# Patient Record
Sex: Male | Born: 1975
Health system: Southern US, Community
[De-identification: ages and names within clinical notes are randomized; demographics above are authoritative.]

## PROBLEM LIST (undated history)

## (undated) DIAGNOSIS — J342 Deviated nasal septum: Secondary | ICD-10-CM

## (undated) DIAGNOSIS — D563 Thalassemia minor: Secondary | ICD-10-CM

## (undated) DIAGNOSIS — J45909 Unspecified asthma, uncomplicated: Secondary | ICD-10-CM

## (undated) HISTORY — PX: TYMPANOSTOMY TUBE PLACEMENT: SHX32

## (undated) HISTORY — DX: Thalassemia minor: D56.3

## (undated) HISTORY — DX: Deviated nasal septum: J34.2

## (undated) HISTORY — DX: Unspecified asthma, uncomplicated: J45.909

---

## 2007-05-13 ENCOUNTER — Encounter: Payer: Self-pay | Admitting: Orthopedic Surgery

## 2007-05-15 ENCOUNTER — Ambulatory Visit: Payer: Self-pay | Admitting: Orthopedic Surgery

## 2007-05-15 DIAGNOSIS — S62329A Displaced fracture of shaft of unspecified metacarpal bone, initial encounter for closed fracture: Secondary | ICD-10-CM | POA: Insufficient documentation

## 2007-06-26 ENCOUNTER — Ambulatory Visit: Payer: Self-pay | Admitting: Orthopedic Surgery

## 2009-06-12 ENCOUNTER — Emergency Department (HOSPITAL_COMMUNITY): Admission: EM | Admit: 2009-06-12 | Discharge: 2009-06-12 | Payer: Self-pay | Admitting: Emergency Medicine

## 2011-05-20 IMAGING — CR DG CHEST 2V
2 series · 2 of 2 positions shown · non-contrast
Comparison: None.

CLINICAL DATA: Fever, cough, shortness of breath

CHEST - 2 VIEW

[w chest pa]
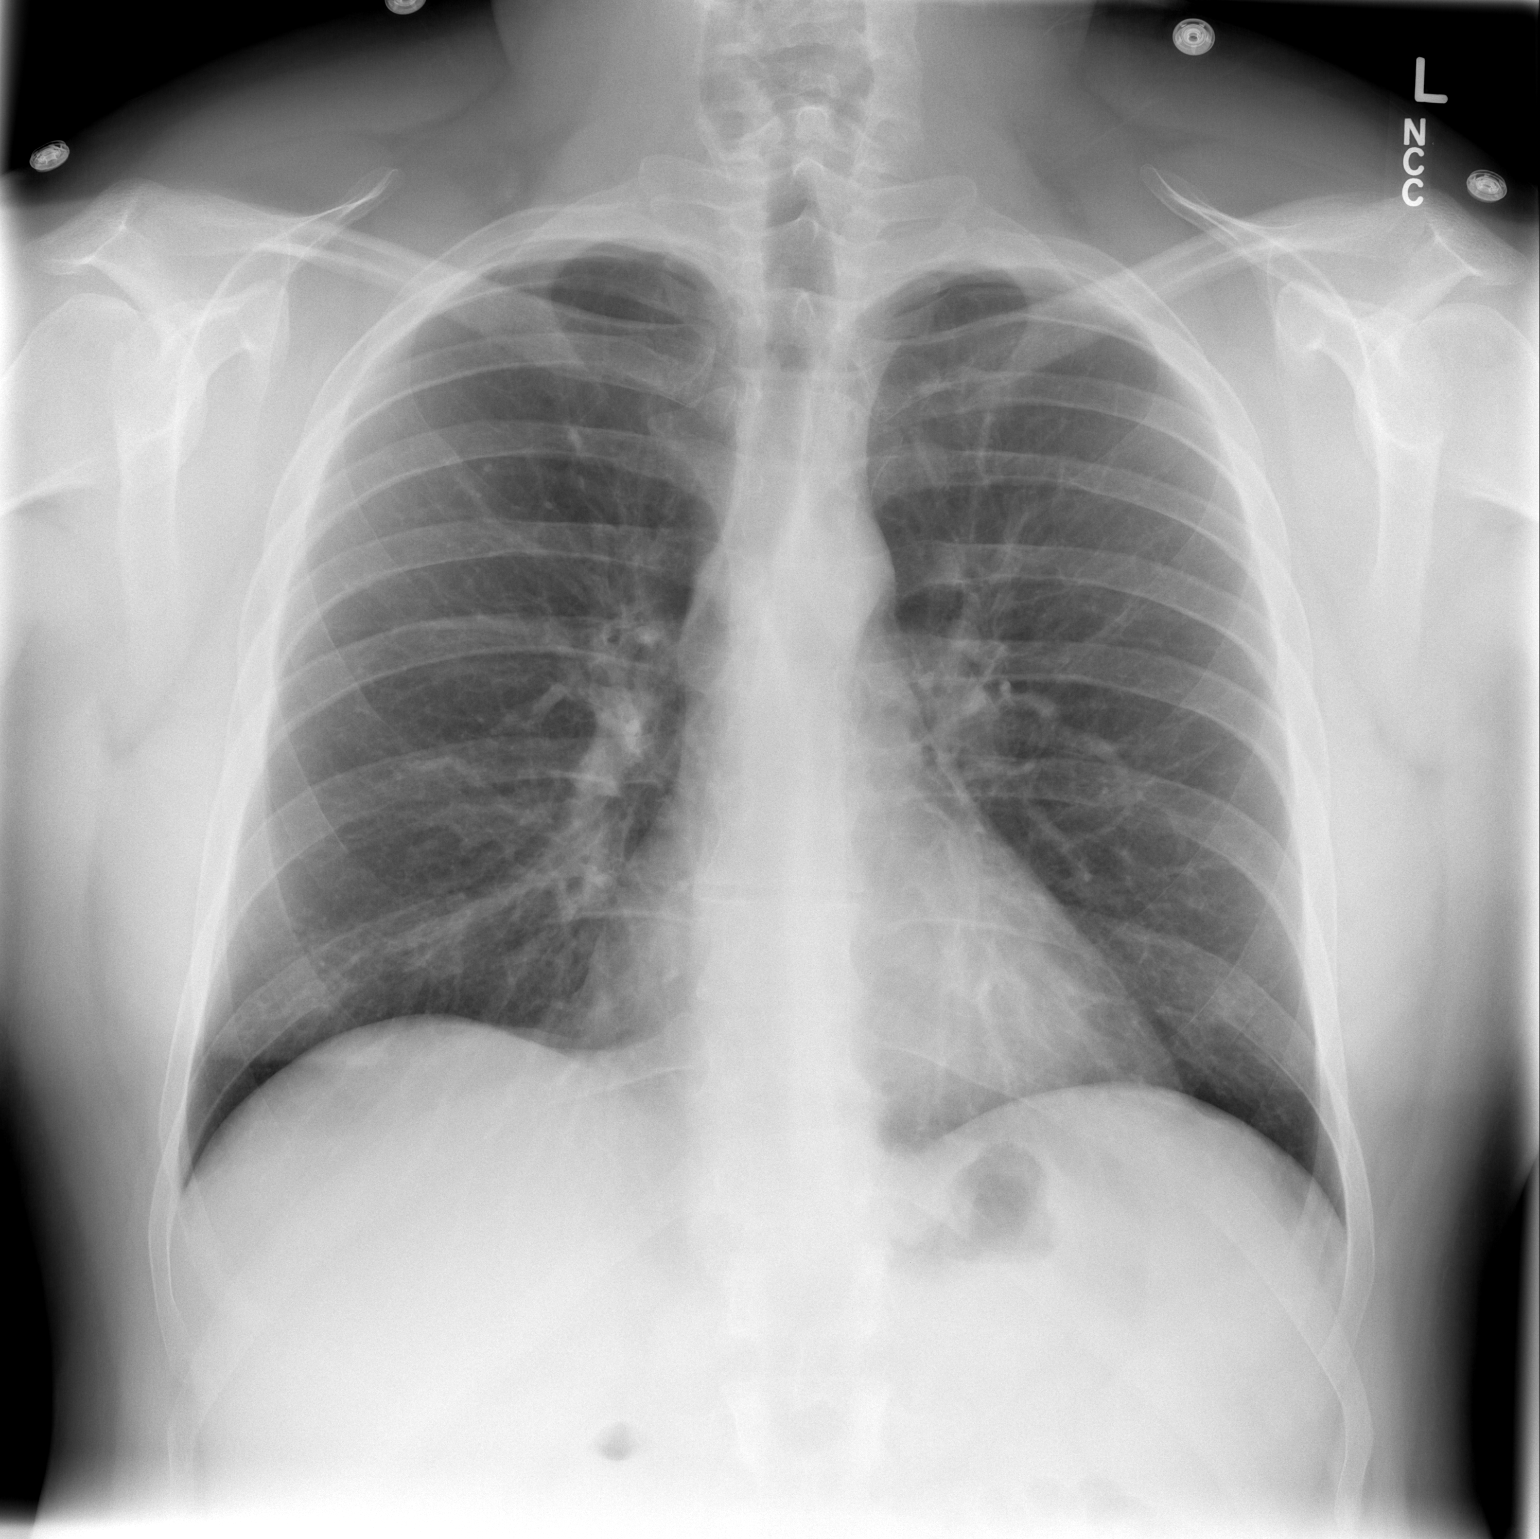

[w chest lat]
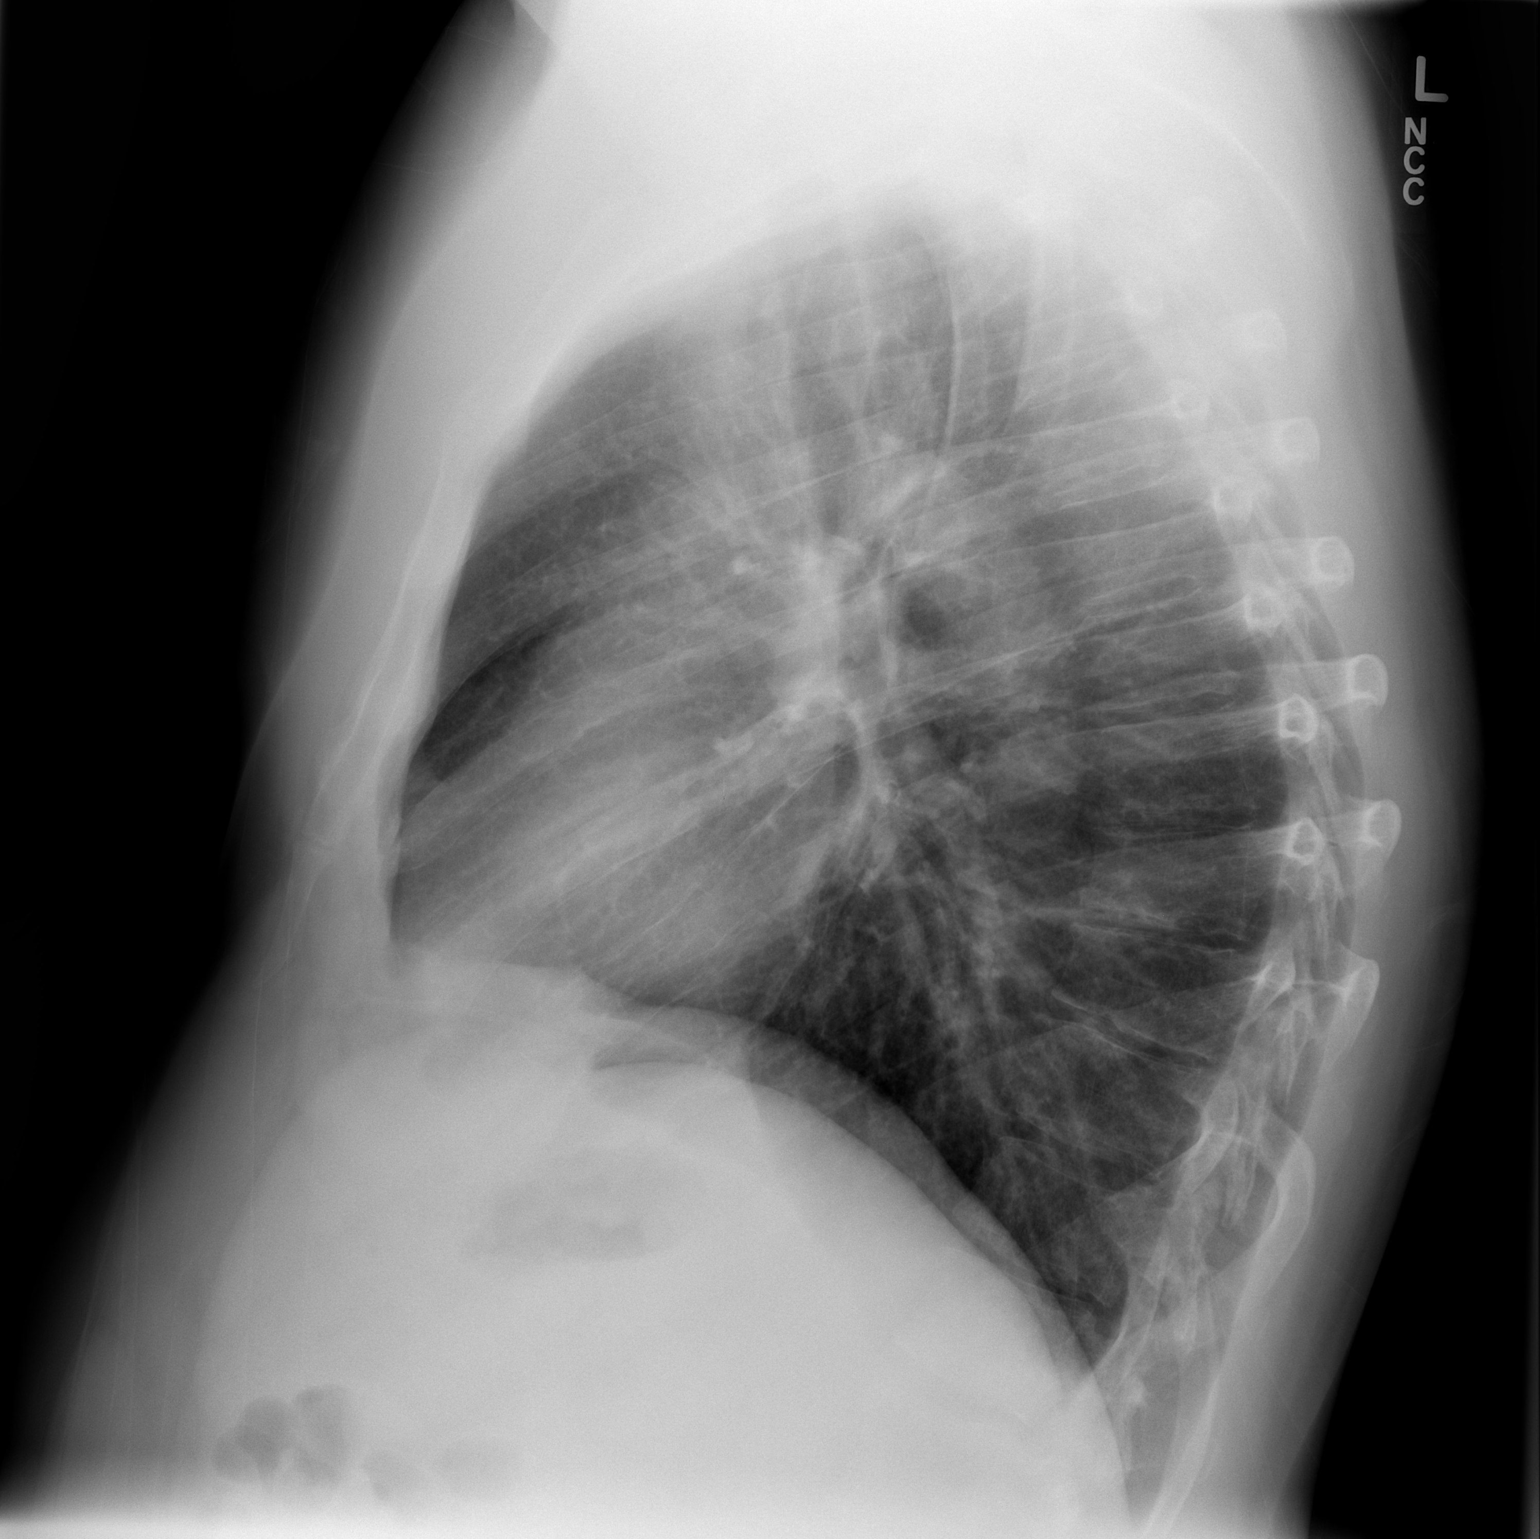

[2 of 2 positions shown; findings below may reference images not displayed]

FINDINGS: Cardiomediastinal silhouette is within normal limits. The
lungs are clear. No pleural effusion.  No pneumothorax.  No acute
osseous abnormality.
IMPRESSION: Normal exam.

## 2019-03-02 ENCOUNTER — Ambulatory Visit (INDEPENDENT_AMBULATORY_CARE_PROVIDER_SITE_OTHER): Payer: Self-pay | Admitting: Internal Medicine

## 2019-03-09 ENCOUNTER — Ambulatory Visit (INDEPENDENT_AMBULATORY_CARE_PROVIDER_SITE_OTHER): Payer: Self-pay | Admitting: Nurse Practitioner

## 2019-03-10 ENCOUNTER — Other Ambulatory Visit: Payer: Self-pay

## 2019-03-10 ENCOUNTER — Other Ambulatory Visit: Payer: Self-pay | Admitting: *Deleted

## 2019-03-10 DIAGNOSIS — Z20822 Contact with and (suspected) exposure to covid-19: Secondary | ICD-10-CM

## 2019-03-13 LAB — NOVEL CORONAVIRUS, NAA: SARS-CoV-2, NAA: DETECTED — AB

## 2019-03-24 ENCOUNTER — Other Ambulatory Visit: Payer: Self-pay

## 2019-03-24 ENCOUNTER — Ambulatory Visit: Payer: 59 | Attending: Internal Medicine

## 2019-03-24 DIAGNOSIS — Z20822 Contact with and (suspected) exposure to covid-19: Secondary | ICD-10-CM

## 2019-03-25 LAB — NOVEL CORONAVIRUS, NAA: SARS-CoV-2, NAA: NOT DETECTED

## 2019-03-30 ENCOUNTER — Ambulatory Visit (INDEPENDENT_AMBULATORY_CARE_PROVIDER_SITE_OTHER): Payer: 59 | Admitting: Nurse Practitioner

## 2019-03-30 ENCOUNTER — Encounter (INDEPENDENT_AMBULATORY_CARE_PROVIDER_SITE_OTHER): Payer: Self-pay | Admitting: Nurse Practitioner

## 2019-03-30 ENCOUNTER — Other Ambulatory Visit: Payer: Self-pay

## 2019-03-30 VITALS — BP 128/76 | HR 89 | Temp 98.6°F | Resp 18 | Ht 70.0 in | Wt 216.0 lb

## 2019-03-30 DIAGNOSIS — Z0001 Encounter for general adult medical examination with abnormal findings: Secondary | ICD-10-CM | POA: Diagnosis not present

## 2019-03-30 DIAGNOSIS — K5900 Constipation, unspecified: Secondary | ICD-10-CM | POA: Diagnosis not present

## 2019-03-30 DIAGNOSIS — R5383 Other fatigue: Secondary | ICD-10-CM | POA: Insufficient documentation

## 2019-03-30 DIAGNOSIS — Z1329 Encounter for screening for other suspected endocrine disorder: Secondary | ICD-10-CM

## 2019-03-30 DIAGNOSIS — J310 Chronic rhinitis: Secondary | ICD-10-CM

## 2019-03-30 DIAGNOSIS — Z131 Encounter for screening for diabetes mellitus: Secondary | ICD-10-CM

## 2019-03-30 DIAGNOSIS — R234 Changes in skin texture: Secondary | ICD-10-CM | POA: Insufficient documentation

## 2019-03-30 DIAGNOSIS — Z139 Encounter for screening, unspecified: Secondary | ICD-10-CM

## 2019-03-30 DIAGNOSIS — R0683 Snoring: Secondary | ICD-10-CM | POA: Insufficient documentation

## 2019-03-30 DIAGNOSIS — Z13220 Encounter for screening for lipoid disorders: Secondary | ICD-10-CM

## 2019-03-30 DIAGNOSIS — Z113 Encounter for screening for infections with a predominantly sexual mode of transmission: Secondary | ICD-10-CM

## 2019-03-30 MED ORDER — FLUTICASONE PROPIONATE 50 MCG/ACT NA SUSP: 2.0000 | Freq: Every day | NASAL | 6 refills | Status: DC

## 2019-03-30 NOTE — Assessment & Plan Note (Signed)
Physical exam was generally benign.  We will collect blood work in 2 days when he will be fasting.  Further recommendations may be made based upon those results.

## 2019-03-30 NOTE — Assessment & Plan Note (Signed)
Unsure of current etiology.  It is probably caused by multiple etiologies.  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  I will collect blood work for further evaluation today.

## 2019-03-30 NOTE — Assessment & Plan Note (Signed)
We discussed the importance of maintaining proper hydration as well as eating enough fiber in his diet.  He was wondering whether or not he would be at [facility] that he could undergo colon cancer screening.  I told him that generally insurance companies will cover screening at the age of 74, and sometimes earlier if he has risk factors.  He denies any family history of colon cancer.  He also denies any current symptoms that are commonly associated with colon cancer.  I encouraged him to notify me if he starts to experience any bloody stools, black tarry stools, unexplained weight loss, worsening fatigue, worsening abdominal pain, worsening constipation.  He tells me he understands.  I recommended currently for his constipation that he focus on drinking at least 100 ounces of water a day as well as trying to include his daily fiber intake.  I recommended that he try over-the-counter docusate sodium as needed for constipation.  If this worsens or continues we will consider referral to gastroenterology.

## 2019-03-30 NOTE — Progress Notes (Signed)
Subjective:  Patient ID: Ethan Weber, male    DOB: 03-05-1976  Age: 43 y.o. MRN: [id number]  CC:  Chief Complaint  Patient presents with  . New Patient (Initial Visit)      HPI  This patient presents the office today to establish care.  He heard about [place] via his mom and stepfather who are patient's here as well.  He generally feels well, but does express some concerns regarding abdominal bloating, snoring, nasal congestion, and a scab to his right ear.  He tells me he does have intermittent abdominal bloating.  He tells me that he does feel he is mildly constipated at times.  He will sometimes have a bowel movement daily other times he can go 2 to 3 days without a bowel movement.  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  He tells me he drinks about 5-6 water bottles a day when he is working, and then may be 2-3 water bottles when he is not working.  He denies any melena, blood in his stool, abnormal unintentional weight loss, severe fatigue.  He does report that he does get mildly fatigued at times.    He also reports that he snores, and uses a CPAP.  He tells me he did attempt to undergo sleep study in the past, but was not able to fall asleep during the study.  He is not sure if he really needs a CPAP or not.  He would like to be evaluated for sleep apnea again.  He also mentions that he has chronic nasal congestion.  He also experiences a feeling of fullness in his ears and a desire to pop his ears.  He is concerned he has a lot of earwax buildup and does use drops for this.  He tells me he has used nasal spray in the past and this has helped, but the congestion always seems to come back.  He also mentions that he has a scab to the right earlobe.  He would like this to be evaluated today as well.  Past Medical History:  Diagnosis Date  . Asthma    Childhood   . Deviated septum       Family History  Problem Relation Age of Onset  . ALS Father   . Heart disease Maternal Grandmother     Social History   Social History Narrative  . Not on file   Social History   Tobacco Use  . Smoking status: Former Smoker    Packs/day: 0.50    Years: 2.00    Pack years: 1.00    Types: Cigarettes    Quit date: 04/09/1998    Years since quitting: 20.9  . Smokeless tobacco: Former Engineer, water Use Topics  . Alcohol use: Yes    Comment: Once a week     No outpatient medications have been marked as taking for the 03/30/19 encounter (Office Visit) with Elenore Paddy, NP.    ROS:  Review of Systems  Constitutional: Positive for malaise/fatigue. Negative for fever.  HENT: Positive for congestion. Negative for ear discharge and ear pain.        Snoring  Eyes: Negative for blurred vision.  Respiratory: Negative for cough, shortness of breath and wheezing.   Cardiovascular: Negative for chest pain and palpitations.  Gastrointestinal: Positive for constipation (varies between daily to  every 2-3 days) and heartburn. Negative for abdominal pain, blood in stool, melena, nausea and vomiting.  Genitourinary: Negative for dysuria and hematuria.  Neurological: Negative for dizziness, sensory change, weakness and headaches.  Psychiatric/Behavioral: Negative for suicidal ideas.     Objective:   Today's Vitals: BP 128/76 (BP Location: Right Arm, Patient Position: Sitting, Cuff Size: Normal)   Pulse 89   Temp 98.6 F (37 C) (Temporal)   Resp 18   Ht 5\' 10"  (1.778 m)   Wt 216 lb (98 kg)   SpO2 96% Comment: wearing mask.  BMI 30.99 kg/m  Vitals with BMI 03/30/2019 05/15/2007  Height 5\' 10"  -  Weight 216 lbs 180 lbs  BMI 30.99 -  Systolic 128 -  Diastolic 76 -  Pulse 89 76     Physical Exam Vitals reviewed.  Constitutional:      General: He is not in acute distress.    Appearance: Normal appearance. He is obese. He is not ill-appearing.    HENT:     Head: Normocephalic and atraumatic.      Right Ear: Tympanic membrane, ear canal and external ear normal.     Left Ear: Tympanic membrane, ear canal and external ear normal.  Eyes:     General: No scleral icterus.    Extraocular Movements: Extraocular movements intact.     Conjunctiva/sclera: Conjunctivae normal.     Pupils: Pupils are equal, round, and reactive to light.  Neck:     Vascular: No carotid bruit.  Cardiovascular:     Rate and Rhythm: Normal rate and regular rhythm.     Pulses: Normal pulses.     Heart sounds: Normal heart sounds.  Pulmonary:     Effort: Pulmonary effort is normal.     Breath sounds: Normal breath sounds.  Abdominal:     General: Bowel sounds are normal. There is no distension.     Palpations: There is no mass.     Tenderness: There is no abdominal tenderness.     Hernia: No hernia is present.  Musculoskeletal:        General: No swelling or tenderness.     Cervical back: Normal range of motion and neck supple. No rigidity.  Lymphadenopathy:     Cervical: No cervical adenopathy.  Skin:    General: Skin is warm and dry.  Neurological:     General: No focal deficit present.     Mental Status: He is alert and oriented to person, place, and time.     Cranial Nerves: No cranial nerve deficit.     Sensory: No sensory deficit.     Motor: No weakness.     Gait: Gait normal.  Psychiatric:        Mood and Affect: Mood normal.        Behavior: Behavior normal.        Judgment: Judgment normal.      PHQ 2: Negative for depression    Assessment   1. Rhinitis, unspecified type       Tests ordered No orders of the defined types were placed in this encounter.    Plan: Please see assessment and plan per problem list below.   Meds ordered this encounter  Medications  . fluticasone (FLONASE) 50 MCG/ACT nasal spray    Sig: Place 2 sprays into both nostrils daily.    Dispense:  16 g    Refill:  6    Order Specific Question:    Supervising Provider  AnswerWilson Singer [[id number]]    Patient to follow-up in 6 weeks.  In addition to performing an annual physical exam today I also performed an office visit to address his concerns as stated above.  Elenore Paddy, NP

## 2019-03-30 NOTE — Patient Instructions (Signed)
Thank you for choosing [facility] as your medical provider! If you have any questions or concerns regarding your health care, please do not hesitate to call our office.  Constipation: Try to make sure you are getting enough fiber from your diet, as well as drinking enough water.  You can get fiber from fruits, vegetables, beans, lentils.  You should have a goal of drinking around 100 ounces of water a day.  If you still have mild constipation intermittently you can try docusate sodium (colace).  This is a medication that she can find over-the-counter.  Snoring/nasal congestion: Use the Flonase nasal spray as prescribed.  I will also refer you to [facility] for further evaluation for sleep apnea.  Ear fullness/feeling like you need to pop your ears: Using Flonase should also help with this symptom.  Try to avoid using the earwax drops more than 4 days in a row.  This is to protect the skin integrity of your healthy skin of your ear.  If the scab to your right ear starts to hurt, you can use over-the-counter Neosporin.  If this does not relieve the pain or if the area starts to get red, swollen, hot, and moderately to severely painful please notify me because it may be that you are getting an infection.  Blood work: We will collect blood work in approximately 2 days.  Please remember to come fasting to that appointment.  I will discuss these results with you either via MyChart or by phone.  Please follow-up as scheduled in 6 weeks. We look forward to seeing you again soon! Have a great Christmas!!  At [facility] we value your feedback. You may receive a survey about your visit today. Please share your experience as we strive to create trusting relationships with our patients to provide genuine, compassionate, quality care.  We appreciate your understanding and patience as we review any laboratory studies, imaging, and other diagnostic tests that are ordered as  we care for you. We do our best to address any and all results in a timely manner. If you do not hear about test results within 1 week, please do not hesitate to contact us. If we referred you to a specialist during your visit or ordered imaging testing, contact the office if you have not been contacted to be scheduled within 1 weeks.  We also encourage the use of MyChart, which contains your medical information for your review as well. If you are not enrolled in this feature, an access code is on this after visit summary for your convenience. Thank you for allowing [place] to be involved in your care.

## 2019-03-30 NOTE — Assessment & Plan Note (Signed)
I will refer him to [facility] for further evaluation of sleep apnea.

## 2019-03-30 NOTE — Assessment & Plan Note (Signed)
I prescribed Flonase nasal spray that he can use 1 to 2 sprays daily as needed for his congestion.  I encouraged him to let me know if this does not improve his congestion.  We will discuss this again at his next follow-up.

## 2019-03-30 NOTE — Assessment & Plan Note (Signed)
Lesion appears to not be infected at this time.  I encouraged him to not pick at the lesion.  I encouraged him to use over-the-counter Neosporin as needed, and to notify me if the area starts to become painful, swell, becomes red, starts to feel warm to touch.  He tells me he understands.

## 2019-04-01 ENCOUNTER — Other Ambulatory Visit (INDEPENDENT_AMBULATORY_CARE_PROVIDER_SITE_OTHER): Payer: Self-pay | Admitting: Nurse Practitioner

## 2019-04-01 ENCOUNTER — Other Ambulatory Visit (INDEPENDENT_AMBULATORY_CARE_PROVIDER_SITE_OTHER): Payer: 59

## 2019-04-01 ENCOUNTER — Other Ambulatory Visit: Payer: Self-pay

## 2019-04-01 DIAGNOSIS — Z1329 Encounter for screening for other suspected endocrine disorder: Secondary | ICD-10-CM

## 2019-04-01 DIAGNOSIS — Z139 Encounter for screening, unspecified: Secondary | ICD-10-CM

## 2019-04-01 DIAGNOSIS — Z1159 Encounter for screening for other viral diseases: Secondary | ICD-10-CM

## 2019-04-01 DIAGNOSIS — Z131 Encounter for screening for diabetes mellitus: Secondary | ICD-10-CM

## 2019-04-01 DIAGNOSIS — Z0001 Encounter for general adult medical examination with abnormal findings: Secondary | ICD-10-CM

## 2019-04-01 DIAGNOSIS — Z113 Encounter for screening for infections with a predominantly sexual mode of transmission: Secondary | ICD-10-CM

## 2019-04-01 DIAGNOSIS — Z13220 Encounter for screening for lipoid disorders: Secondary | ICD-10-CM

## 2019-04-01 DIAGNOSIS — R5383 Other fatigue: Secondary | ICD-10-CM

## 2019-04-02 LAB — COMPLETE METABOLIC PANEL WITH GFR
AG Ratio: 2 (calc) (ref 1.0–2.5)
ALT: 36 U/L (ref 9–46)
AST: 30 U/L (ref 10–40)
Albumin: 4.7 g/dL (ref 3.6–5.1)
Alkaline phosphatase (APISO): 51 U/L (ref 36–130)
BUN: 14 mg/dL (ref 7–25)
CO2: 26 mmol/L (ref 20–32)
Calcium: 9.1 mg/dL (ref 8.6–10.3)
Chloride: 103 mmol/L (ref 98–110)
Creat: 1.18 mg/dL (ref 0.60–1.35)
GFR, Est African American: 88 mL/min/{1.73_m2} (ref >=60)
GFR, Est Non African American: 76 mL/min/{1.73_m2} (ref >=60)
Globulin: 2.3 g/dL (calc) (ref 1.9–3.7)
Glucose, Bld: 93 mg/dL (ref 65–99)
Potassium: 4.3 mmol/L (ref 3.5–5.3)
Sodium: 140 mmol/L (ref 135–146)
Total Bilirubin: 1 mg/dL (ref 0.2–1.2)
Total Protein: 7 g/dL (ref 6.1–8.1)

## 2019-04-02 LAB — LIPID PANEL
Cholesterol: 278 mg/dL — ABNORMAL HIGH (ref <200)
HDL: 53 mg/dL (ref >=40)
LDL Cholesterol (Calc): 191 mg/dL (calc) — ABNORMAL HIGH
Non-HDL Cholesterol (Calc): 225 mg/dL (calc) — ABNORMAL HIGH (ref <130)
Total CHOL/HDL Ratio: 5.2 (calc) — ABNORMAL HIGH (ref <5.0)
Triglycerides: 170 mg/dL — ABNORMAL HIGH (ref <150)

## 2019-04-02 LAB — HEMOGLOBIN A1C
Hgb A1c MFr Bld: 5.2 % of total Hgb (ref <5.7)
Mean Plasma Glucose: 103 (calc)
eAG (mmol/L): 5.7 (calc)

## 2019-04-02 LAB — CBC
HCT: 37.6 % — ABNORMAL LOW (ref 38.5–50.0)
Hemoglobin: 11.6 g/dL — ABNORMAL LOW (ref 13.2–17.1)
MCH: 18.8 pg — ABNORMAL LOW (ref 27.0–33.0)
MCHC: 30.9 g/dL — ABNORMAL LOW (ref 32.0–36.0)
MCV: 60.8 fL — ABNORMAL LOW (ref 80.0–100.0)
Platelets: 193 10*3/uL (ref 140–400)
RBC: 6.18 10*6/uL — ABNORMAL HIGH (ref 4.20–5.80)
RDW: 17.4 % — ABNORMAL HIGH (ref 11.0–15.0)
WBC: 5.3 10*3/uL (ref 3.8–10.8)

## 2019-04-02 LAB — HIV ANTIBODY (ROUTINE TESTING W REFLEX): HIV 1&2 Ab, 4th Generation: NONREACTIVE

## 2019-04-02 LAB — RPR: RPR Ser Ql: NONREACTIVE

## 2019-04-02 LAB — TSH: TSH: 2.37 mIU/L (ref 0.40–4.50)

## 2019-04-02 LAB — T4, FREE: Free T4: 1.1 ng/dL (ref 0.8–1.8)

## 2019-04-02 LAB — T3, FREE: T3, Free: 3.6 pg/mL (ref 2.3–4.2)

## 2019-04-02 LAB — HEPATITIS C ANTIBODY
Hepatitis C Ab: NONREACTIVE
SIGNAL TO CUT-OFF: 0.01 (ref <1.00)

## 2019-04-16 ENCOUNTER — Ambulatory Visit (INDEPENDENT_AMBULATORY_CARE_PROVIDER_SITE_OTHER): Payer: 59 | Admitting: Neurology

## 2019-04-16 ENCOUNTER — Other Ambulatory Visit: Payer: Self-pay

## 2019-04-16 ENCOUNTER — Encounter: Payer: Self-pay | Admitting: Neurology

## 2019-04-16 VITALS — BP 98/78 | HR 63 | Temp 97.9°F | Ht 70.0 in | Wt 216.0 lb

## 2019-04-16 DIAGNOSIS — R0683 Snoring: Secondary | ICD-10-CM | POA: Diagnosis not present

## 2019-04-16 DIAGNOSIS — J300 Vasomotor rhinitis: Secondary | ICD-10-CM | POA: Diagnosis not present

## 2019-04-16 DIAGNOSIS — G4733 Obstructive sleep apnea (adult) (pediatric): Secondary | ICD-10-CM | POA: Diagnosis not present

## 2019-04-16 DIAGNOSIS — G4726 Circadian rhythm sleep disorder, shift work type: Secondary | ICD-10-CM

## 2019-04-16 DIAGNOSIS — R519 Headache, unspecified: Secondary | ICD-10-CM

## 2019-04-16 DIAGNOSIS — Z9989 Dependence on other enabling machines and devices: Secondary | ICD-10-CM

## 2019-04-16 DIAGNOSIS — E669 Obesity, unspecified: Secondary | ICD-10-CM

## 2019-04-16 NOTE — Progress Notes (Signed)
SLEEP MEDICINE CLINIC    Provider:  Melvyn Novas, MD  Primary Care Physician:  Wilson Singer, MD [address] [place] [address]     Referring Provider: Wilson Singer, Md [address] [place],  [place] [address]          Chief Complaint according to patient   Patient presents with:    . New Patient (Initial Visit)     pt alone, states that while he was incarcerated until 12-2017 , he had a sleep study around 2015, he doesn't remember getting the results but was set up with the machine in prison  in 2016. since he has been out he has been using the machine regularly , but is unable to get supplies. He states he is unsure if the pressure is correctly set, because pressure feel like just blowing a lot of air. He can tell a difference when he is not using the machine.      HISTORY OF PRESENT ILLNESS:  Ethan Weber is a 42. year old White or Caucasian male patient seen here as a referral on 04/16/2019 from dr Karilyn Cota  for a new fitting of supplies. He has also a deviated septum, he has gained weight since release form prison, and he reports using a FFM with his machine.   Chief concern according to patient :  I want to use CPAP but can't proof that I am compliant.    I have the pleasure of seeing Ethan Weber today, a right -handed White or Caucasian male with a possible sleep disorder.  he   has a past medical history of Asthma and Deviated septum.he used retainers as a teenager.    Sleep relevant medical history: Nocturia; 1-2 , he works night shifts,  He snores very loudly, since childhood- deviated septum , weight?  Family medical /sleep history:  His mother is a Snorer. She is a smoker .     Social history:  Patient is working as Production assistant, radio-  and lives in a household with 3 persons- a lady friend and her son- he has 2 sons from another relationship. grandchildren.  The patient currently works in shifts( Chief Technology Officer,) Pets are not  present. Tobacco use- quit 20 years ago.ETOH use liquor- on occassion.  Caffeine intake in form of Coffee( 3 cups a day) Soda( rare ) Tea (none ) or energy drinks. Regular exercise in form of work. Hobbies : sports.   Sleep habits are as follows:  he wakes at 3.30 PM and eats at 12 midnight- work ends 7 AM.  12 hour shifts. The patient goes to bed at 8 PM - in a cool, quiet and mostly dark bedroom- and continues to sleep for 3-4 hours, wakes for 0-3 bathroom breaks.  The preferred sleep position is laterally, but he turns supine-  , with the support of 1 pillow. Dreams are reportedly rare.  3.30 PM is the usual rise time. The patient wakes up spontaneously-without an alarm.  He reports not feeling refreshed or restored in AM, with symptoms such as dry mouth, morning headaches, and residual fatigue.- worse when not using CPAP-  Naps are taken in frequently.   Review of Systems: Out of a complete 14 system review, the patient complains of only the following symptoms, and all other reviewed systems are negative.:  Fatigue, sleepiness , snoring, fragmented sleep, shift work sleep disorder.   How likely are you to doze in the following situations: 0 =  not likely, 1 = slight chance, 2 = moderate chance, 3 = high chance   Sitting and Reading? Watching Television? Sitting inactive in a public place (theater or meeting)? As a passenger in a car for an hour without a break? Lying down in the afternoon when circumstances permit? Sitting and talking to someone? Sitting quietly after lunch without alcohol? In a car, while stopped for a few minutes in traffic?   Total = 10-12/ 24 points   FSS endorsed at 22/ 63 points.   Social History   Socioeconomic History  . Marital status: Single    Spouse name: Not on file  . Number of children: 2  . Years of education: Not on file  . Highest education level: Not on file  Occupational History  . Occupation: Glass blower/designer    Comment: [employer]   Tobacco Use  . Smoking status: Former Smoker    Packs/day: 0.50    Years: 2.00    Pack years: 1.00    Types: Cigarettes    Quit date: 04/09/1998    Years since quitting: 21.0  . Smokeless tobacco: Former Network engineer and Sexual Activity  . Alcohol use: Yes    Comment: Once a week  . Drug use: Not Currently    Comment: Marijuana in the past; quit in 1998  . Sexual activity: Not on file  Other Topics Concern  . Not on file  Social History Narrative  . Not on file   Social Determinants of Health   Financial Resource Strain:   . Difficulty of Paying Living Expenses: Not on file  Food Insecurity:   . Worried About Charity fundraiser in the Last Year: Not on file  . Ran Out of Food in the Last Year: Not on file  Transportation Needs:   . Lack of Transportation (Medical): Not on file  . Lack of Transportation (Non-Medical): Not on file  Physical Activity:   . Days of Exercise per Week: Not on file  . Minutes of Exercise per Session: Not on file  Stress:   . Feeling of Stress : Not on file  Social Connections:   . Frequency of Communication with Friends and Family: Not on file  . Frequency of Social Gatherings with Friends and Family: Not on file  . Attends Religious Services: Not on file  . Active Member of Clubs or Organizations: Not on file  . Attends Archivist Meetings: Not on file  . Marital Status: Not on file    Family History  Problem Relation Age of Onset  . ALS Father   . Heart disease Maternal Grandmother     Past Medical History:  Diagnosis Date  . Asthma    Childhood  . Deviated septum     Past Surgical History:  Procedure Laterality Date  . TYMPANOSTOMY TUBE PLACEMENT Bilateral    Age 37     Current Outpatient Medications on File Prior to Visit  Medication Sig Dispense Refill  . fluticasone (FLONASE) 50 MCG/ACT nasal spray Place 2 sprays into both nostrils daily. 16 g 6   No current facility-administered medications on file prior  to visit.    Allergies  Allergen Reactions  . Penicillins     Physical exam:  Today's Vitals   04/16/19 1508  BP: 98/78  Pulse: 63  Temp: 97.9 F (36.6 C)  Weight: 216 lb (98 kg)  Height: 5\' 10"  (1.778 m)   Body mass index is 30.99 kg/m.   Wt  Readings from Last 3 Encounters:  04/16/19 216 lb (98 kg)  03/30/19 216 lb (98 kg)     Ht Readings from Last 3 Encounters:  04/16/19 5\' 10"  (1.778 m)  03/30/19 5\' 10"  (1.778 m)      General: The patient is awake, alert and appears not in acute distress. The patient is well groomed. Head: Normocephalic, atraumatic. Neck is supple. Mallampati 2,  neck circumference: 17. 5  inches . Nasal airflow not  patent.   Retrognathia is not seen.  Cardiovascular:  Regular rate and cardiac rhythm by pulse,  without distended neck veins. Respiratory: Lungs are clear to auscultation.  Skin:  Without evidence of ankle edema, or rash. Trunk: The patient's posture is erect.   Neurologic exam : The patient is awake and alert, oriented to place and time.   Memory subjective described as intact.  Attention span & concentration ability appears normal.  Speech is fluent,  without  dysarthria, dysphonia or aphasia.  Mood and affect are appropriate.   Cranial nerves: no loss of smell or taste reported  Pupils are equal and briskly reactive to light. Funduscopic exam deferred. .  Extraocular movements in vertical and horizontal planes were intact and without nystagmus. No Diplopia. Visual fields by finger perimetry are intact. Hearing was intact to soft voice and finger rubbing.   Facial sensation reported  intact.  Facial motor strength is symmetric and tongue and uvula move midline.  Neck ROM : rotation, tilt and flexion extension were normal for age and shoulder shrug was symmetrical.    Motor exam:  Symmetric bulk, tone and ROM.   Normal tone without cog wheeling, symmetric grip strength .   Sensory:  Fine touch, pinprick and vibration were  normal.  Proprioception tested in the upper extremities was normal.   Coordination: Rapid alternating movements in the fingers/hands were of normal speed.  The Finger-to-nose maneuver was intact without evidence of ataxia, dysmetria or tremor.   Gait and station: Patient could rise unassisted from a seated position, walked without assistive device.  Stance is of normal width/ base and the patient turned with 3 steps- observation by RN .  Toe and heel walk were deferred.  Deep tendon reflexes: in the  upper and lower extremities are symmetric and intact.  Babinski response was deferred.      Mr. 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  He has gained some weight over the last year, and he has used a CPAP initially not compliantly for about 4-1/2 years.   Over the last year he has noted that the CPAP helps him sleep better and his sleep is of a higher quality more restorative and refreshing, he is not snoring is loud so he also uses CPAP out of consideration for his bed partner.  He has chronic nasal congestion and is known to have a severe septal deviation.  He has used nasal spray he is a former smoker but has not smoked in 20 years now.  He is normocephalic and atraumatic and at a height of 5 foot 10 inches weighs 216 pounds.  Blood pressure has been normal heart rate is regular.     My main goal for the patient is to establish the diagnosis and he may be able to do this with a home sleep test  which she can use in daytime when he sleeps after a night shift.  Once we have his apnea index I would be more than happy to provide a new machine later this summer however in the meantime I will try to get some supplies for his current machine of which we do not know the settings.  I may not be able to replace headgear tubing and filter at  this time but I will write an order for a [employer]-durable medical equipment company-to provide him with such.  I will show him a selection of mask and will see which would be the best likely fitting for him.  After the home sleep test he will meet with me or my nurse practitioner in 2 to 3 months and we can from there on decide when to prescribe a new machine.  This machine would be covered by his health insurer if the old machine is 75 years old or older.   I found a printed booklet accompaniment for this machine which dates to 2012.    After spending a total time of  45 minutes face to face and additional time for physical and neurologic examination, review of laboratory studies,  personal review of imaging studies, reports and results of other testing and review of referral information / records as far as provided in visit, I have established the following assessments:    My Plan is to proceed with:  1) Donation of a SIMPLUS FFM in medium from [facility]  He can resume using the current machine in the meantime.  2) HST order for apnea confirmation.  3)  Auto CPAP order can follow after HST results are back. 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  5) nasal septal deviation and rhinitis. continue nasal spray.   I would like to thank Wilson Singer, MD and Wilson Singer, Md [address] [place],  [place] [address] for allowing me to meet with and to take care of this pleasant patient.    I plan to follow up  through our NP within 2-3 month.   CC: I will share my notes with PCP .  Electronically signed by: Melvyn Novas, MD 04/16/2019 3:33 PM  [facility] Sleep Board certified by The [employer] of Sleep Medicine and Diplomate of the [employer] of Sleep Medicine. Board certified In Neurology through the ABPN, Fellow of the [employer] of Neurology. Medical Director of [facility].

## 2019-05-14 ENCOUNTER — Ambulatory Visit (INDEPENDENT_AMBULATORY_CARE_PROVIDER_SITE_OTHER): Payer: 59 | Admitting: Nurse Practitioner

## 2019-05-14 ENCOUNTER — Other Ambulatory Visit: Payer: Self-pay

## 2019-05-14 ENCOUNTER — Encounter (INDEPENDENT_AMBULATORY_CARE_PROVIDER_SITE_OTHER): Payer: Self-pay | Admitting: Nurse Practitioner

## 2019-05-14 VITALS — BP 130/80 | HR 64 | Temp 98.4°F | Resp 18 | Ht 70.0 in | Wt 211.6 lb

## 2019-05-14 DIAGNOSIS — D649 Anemia, unspecified: Secondary | ICD-10-CM | POA: Insufficient documentation

## 2019-05-14 DIAGNOSIS — R0683 Snoring: Secondary | ICD-10-CM

## 2019-05-14 DIAGNOSIS — E785 Hyperlipidemia, unspecified: Secondary | ICD-10-CM | POA: Diagnosis not present

## 2019-05-14 NOTE — Assessment & Plan Note (Signed)
He will follow-up with neurology as scheduled.

## 2019-05-14 NOTE — Assessment & Plan Note (Signed)
We did discuss lifestyle changes aimed at helping him control his cholesterol.  I recommended that he start a plant-based diet and may be consider intermittent fasting.  He will consider this, I told him that if this is not a realistic goal that he should at least try to reduce his intake of animal-based products and increase his intake of plant-based products.  Will recollect lipid panel in a few months for further evaluation.  If LDL remains elevated we will need to seriously consider initiating statin therapy

## 2019-05-14 NOTE — Assessment & Plan Note (Signed)
I am going to check CBC and check iron levels for further evaluation.  I think most likely etiology is hemorrhoidal bleeding.  However, if he remains anemic and is deficient in iron I will refer him to gastroenterology for further evaluation.  The patient is aware of this and tells me he understands.

## 2019-05-14 NOTE — Progress Notes (Signed)
Subjective:  Patient ID: Ethan Weber, male    DOB: 24-Apr-1975  Age: 44 y.o. MRN: [id number]  CC:  Chief Complaint  Patient presents with  . Follow-up    Anemia, hyperlipidemia, snoring      HPI  This patient comes in today for the above.  Anemia: He underwent his annual physical exam and blood work was drawn at his last office visit.  It did show that he had mild anemia.  Upon further investigation today he does admit that intermittently he will see some blood on tissue when he wipes after having a bowel movement.  He tells me that this is not often and has been sometime since he has noted this.  He does tell me he has a history of hemorrhoids.  Hyperlipidemia: Last lipid panel came back showing LDL greater than 190, total cholesterol 278, HDL 53, triglycerides 170.  That was a fasting panel.  Snoring: I referred him to neurology for further evaluation of his snoring as he believes he may have a history of sleep apnea.  He tells me he has seen the neurologist and he is due for sleep study later next week.   Past Medical History:  Diagnosis Date  . Asthma    Childhood  . Deviated septum       Family History  Problem Relation Age of Onset  . ALS Father   . Heart disease Maternal Grandmother     Social History   Social History Narrative  . Not on file   Social History   Tobacco Use  . Smoking status: Former Smoker    Packs/day: 0.50    Years: 2.00    Pack years: 1.00    Types: Cigarettes    Quit date: 04/09/1998    Years since quitting: 21.1  . Smokeless tobacco: Former Network engineer Use Topics  . Alcohol use: Yes    Comment: Once a week     Current Meds  Medication Sig  . fluticasone (FLONASE) 50 MCG/ACT nasal spray Place 2 sprays into both nostrils daily.    ROS:  Review of Systems  Constitutional: Negative for fever and malaise/fatigue.  Respiratory: Negative for shortness of breath.   Cardiovascular: Negative for chest pain and  palpitations.  Gastrointestinal: Negative for abdominal pain, blood in stool and heartburn.  Neurological: Negative for dizziness.     Objective:   Today's Vitals: BP 130/80 (BP Location: Right Arm, Patient Position: Sitting, Cuff Size: Normal)   Pulse 64   Temp 98.4 F (36.9 C) (Temporal)   Resp 18   Ht 5\' 10"  (1.778 m)   Wt 211 lb 9.6 oz (96 kg)   SpO2 97% Comment: wearing mask.  BMI 30.36 kg/m  Vitals with BMI 05/14/2019 04/16/2019 03/30/2019  Height 5\' 10"  5\' 10"  5\' 10"   Weight 211 lbs 10 oz 216 lbs 216 lbs  BMI 30.36 27.78 24.23  Systolic 536 98 144  Diastolic 80 78 76  Pulse 64 63 89     Physical Exam Vitals reviewed.  Constitutional:      Appearance: Normal appearance.  HENT:     Head: Normocephalic and atraumatic.  Cardiovascular:     Rate and Rhythm: Normal rate and regular rhythm.  Pulmonary:     Effort: Pulmonary effort is normal.     Breath sounds: Normal breath sounds.  Musculoskeletal:     Cervical back: Neck supple.  Skin:    General: Skin is warm and dry.  Neurological:  Mental Status: He is alert and oriented to person, place, and time.  Psychiatric:        Mood and Affect: Mood normal.        Behavior: Behavior normal.        Thought Content: Thought content normal.        Judgment: Judgment normal.          Assessment   1. Anemia, unspecified type       Tests ordered Orders Placed This Encounter  Procedures  . CBC  . Iron and TIBC  . Ferritin     Plan: Please see assessment and plan per problem list below.   No orders of the defined types were placed in this encounter.   Patient to follow-up in 6 weeks for possible repeat annual exam.  He told me he may need another annual exam for his insurance prior to the end of March.  I told him if he finds that he does not need repeat annual exam that we can reschedule this appointment for 3 months from now.  Elenore Paddy, NP

## 2019-05-14 NOTE — Patient Instructions (Signed)
Thank you for choosing [facility] as your medical provider! If you have any questions or concerns regarding your health care, please do not hesitate to call our office.  I will recheck your blood work today to further evaluate your anemia.  Please try to make some dietary changes that we discussed today in the office in hopes of improving your cholesterol panel.  See further recommendations below.  Please follow-up as scheduled in 6 weeks. We look forward to seeing you again soon!   At [facility] we value your feedback. You may receive a survey about your visit today. Please share your experience as we strive to create trusting relationships with our patients to provide genuine, compassionate, quality care.  We appreciate your understanding and patience as we review any laboratory studies, imaging, and other diagnostic tests that are ordered as we care for you. We do our best to address any and all results in a timely manner. If you do not hear about test results within 1 week, please do not hesitate to contact us. If we referred you to a specialist during your visit or ordered imaging testing, contact the office if you have not been contacted to be scheduled within 1 weeks.  We also encourage the use of MyChart, which contains your medical information for your review as well. If you are not enrolled in this feature, an access code is on this after visit summary for your convenience. Thank you for allowing [place] to be involved in your care.   [facility] Dietary Recommendations for Weight Loss What to Avoid . Avoid added sugars o Often added sugar can be found in processed foods such as many condiments, dry cereals, cakes, cookies, chips, crisps, crackers, candies, sweetened drinks, etc.  o Read labels and AVOID/DECREASE use of foods with the following in their ingredient list: Sugar, fructose, high fructose corn syrup, sucrose, glucose, maltose, dextrose, molasses,  cane sugar, brown sugar, any type of syrup, agave nectar, etc.   . Avoid snacking in between meals . Avoid foods made with flour o If you are going to eat food made with flour, choose those made with whole-grains; and, minimize your consumption as much as is tolerable . Avoid processed foods o These foods are generally stocked in the middle of the grocery store. Focus on shopping on the perimeter of the grocery.  . Avoid Meat  o We recommend following a plant-based diet at [facility]. Thus, we recommend avoiding meat as a general rule. Consider eating beans, legumes, eggs, and/or dairy products for regular protein sources o If you plan on eating meat limit to 4 ounces of meat at a time and choose lean options such as Fish, chicken, [place]. Avoid red meat intake such as pork and/or steak What to Include . Vegetables o GREEN LEAFY VEGETABLES: Kale, spinach, mustard greens, collard greens, cabbage, broccoli, etc. o OTHER: Asparagus, cauliflower, eggplant, carrots, peas, Brussel sprouts, tomatoes, bell peppers, zucchini, beets, cucumbers, etc. . Grains, seeds, and legumes o Beans: kidney beans, black eyed peas, garbanzo beans, black beans, pinto beans, etc. o Whole, unrefined grains: brown rice, barley, bulgur, oatmeal, etc. . Healthy fats  o Avoid highly processed fats such as vegetable oil o Examples of healthy fats: avocado, olives, virgin olive oil, dark chocolate (?72% Cocoa), nuts (peanuts, almonds, walnuts, cashews, pecans, etc.) . None to Low Intake of Animal Sources of Protein o Meat sources: chicken, [place], salmon, tuna. Limit to 4 ounces of meat at one  time. o Consider limiting dairy sources, but when choosing dairy focus on: PLAIN [place] yogurt, cottage cheese, high-protein milk . Fruit o Choose berries  When to Eat . Intermittent Fasting: o Choosing not to eat for a specific time period, but DO FOCUS ON HYDRATION when fasting o Multiple Techniques: - Time Restricted  Eating: eat 3 meals in a day, each meal lasting no more than 60 minutes, no snacks between meals - 16-18 hour fast: fast for 16 to 18 hours up to 7 days a week. Often suggested to start with 2-3 nonconsecutive days per week.  . Remember the time you sleep is counted as fasting.  . Examples of eating schedule: Fast from 7:00pm-11:00am. Eat between 11:00am-7:00pm.  - 24-hour fast: fast for 24 hours up to every other day. Often suggested to start with 1 day per week . Remember the time you sleep is counted as fasting . Examples of eating schedule:  o Eating day: eat 2-3 meals on your eating day. If doing 2 meals, each meal should last no more than 90 minutes. If doing 3 meals, each meal should last no more than 60 minutes. Finish last meal by 7:00pm. o Fasting day: Fast until 7:00pm.  o IF YOU FEEL UNWELL FOR ANY REASON/IN ANY WAY WHEN FASTING, STOP FASTING BY EATING A NUTRITIOUS SNACK OR LIGHT MEAL o ALWAYS FOCUS ON HYDRATION DURING FASTS - Acceptable Hydration sources: water, broths, tea/coffee (black tea/coffee is best but using a small amount of whole-fat dairy products in coffee/tea is acceptable).  - Poor Hydration Sources: anything with sugar or artificial sweeteners added to it  These recommendations have been developed for patients that are actively receiving medical care from either Dr. Anastasio Champion or Jeralyn Ruths, DNP, NP-C at [facility]. These recommendations are developed for patients with specific medical conditions and are not meant to be distributed or used by others that are not actively receiving care from either provider listed above at [facility]. It is not appropriate to participate in the above eating plans without proper medical supervision.   Reference: Rexanne Mano. The obesity code. [place]; 2016.

## 2019-05-15 ENCOUNTER — Other Ambulatory Visit (INDEPENDENT_AMBULATORY_CARE_PROVIDER_SITE_OTHER): Payer: Self-pay | Admitting: Nurse Practitioner

## 2019-05-15 DIAGNOSIS — D509 Iron deficiency anemia, unspecified: Secondary | ICD-10-CM

## 2019-05-15 LAB — CBC
HCT: 39.3 % (ref 38.5–50.0)
Hemoglobin: 12.1 g/dL — ABNORMAL LOW (ref 13.2–17.1)
MCH: 18.8 pg — ABNORMAL LOW (ref 27.0–33.0)
MCHC: 30.8 g/dL — ABNORMAL LOW (ref 32.0–36.0)
MCV: 61.2 fL — ABNORMAL LOW (ref 80.0–100.0)
Platelets: 202 10*3/uL (ref 140–400)
RBC: 6.42 10*6/uL — ABNORMAL HIGH (ref 4.20–5.80)
RDW: 18.3 % — ABNORMAL HIGH (ref 11.0–15.0)
WBC: 6.1 10*3/uL (ref 3.8–10.8)

## 2019-05-15 LAB — FERRITIN: Ferritin: 70 ng/mL (ref 38–380)

## 2019-05-15 LAB — IRON, TOTAL/TOTAL IRON BINDING CAP
Iron: 66 ug/dL (ref 50–180)
TIBC: 331 mcg/dL (calc) (ref 250–425)

## 2019-05-15 LAB — IRON,?TOTAL/TOTAL IRON BINDING CAP: %SAT: 20 % (calc) (ref 20–48)

## 2019-05-15 NOTE — Progress Notes (Signed)
After reviewing and discussing patient's most recent blood work for further evaluation of anemia with my supervising physician (with Dr. Karilyn Cota) we decided next step is to perform hemoglobin electrophoresis for further evaluation of possible thalassemia.     Nellie, please call this patient and let him know that based on his lab work he remains anemic but his iron counts are normal.  This may represent a inheritable abnormality that is causing his anemia.  Thus, to further determine why he is anemic the recommendation is that we perform additional testing on his blood work.  Thus we will need to get another blood sample next time he is seen in the office.  He should be scheduled already for an appointment in 6 weeks, if you would like to keep this appointment we can get the blood work at that time or if he wants to come in just for blood draw before that you can schedule him for a lab draw appointment.  Please let me know what he decides.  Thank you.

## 2019-05-17 ENCOUNTER — Encounter (INDEPENDENT_AMBULATORY_CARE_PROVIDER_SITE_OTHER): Payer: Self-pay | Admitting: Nurse Practitioner

## 2019-05-18 ENCOUNTER — Other Ambulatory Visit: Payer: Self-pay

## 2019-05-18 ENCOUNTER — Ambulatory Visit (INDEPENDENT_AMBULATORY_CARE_PROVIDER_SITE_OTHER): Payer: 59 | Admitting: Neurology

## 2019-05-18 DIAGNOSIS — G4733 Obstructive sleep apnea (adult) (pediatric): Secondary | ICD-10-CM

## 2019-05-18 DIAGNOSIS — J300 Vasomotor rhinitis: Secondary | ICD-10-CM

## 2019-05-18 DIAGNOSIS — E669 Obesity, unspecified: Secondary | ICD-10-CM

## 2019-05-18 DIAGNOSIS — R0683 Snoring: Secondary | ICD-10-CM

## 2019-05-18 DIAGNOSIS — R519 Headache, unspecified: Secondary | ICD-10-CM

## 2019-05-18 DIAGNOSIS — G4726 Circadian rhythm sleep disorder, shift work type: Secondary | ICD-10-CM

## 2019-05-18 NOTE — Progress Notes (Signed)
Called left message to call office back. Then I will given instructions.

## 2019-05-18 NOTE — Progress Notes (Signed)
I actually got to talk to him via mychart. He stated he was okay with keeping his appt in March as a blood draw appt. I have changed that appt type to lab appt and he will follow-up after we get results from blood work in march.

## 2019-05-19 NOTE — Progress Notes (Signed)
Patient called. To f/u on both lab results  notes. To make sure patient was ware of what the NP need him to do at this time. He stated that Maralyn Sago called him and explained results. He is going to be back for blood work next week on 2/17.

## 2019-05-27 DIAGNOSIS — Z9989 Dependence on other enabling machines and devices: Secondary | ICD-10-CM | POA: Insufficient documentation

## 2019-05-27 DIAGNOSIS — G4726 Circadian rhythm sleep disorder, shift work type: Secondary | ICD-10-CM | POA: Insufficient documentation

## 2019-05-27 DIAGNOSIS — G4733 Obstructive sleep apnea (adult) (pediatric): Secondary | ICD-10-CM | POA: Insufficient documentation

## 2019-05-27 DIAGNOSIS — E669 Obesity, unspecified: Secondary | ICD-10-CM | POA: Insufficient documentation

## 2019-05-27 NOTE — Progress Notes (Signed)
Study Date: May 18, 2019 S/H/A Version: 003.003.003.003 / 4.0.1515 / 22 History:   Ethan Weber is a 34. year old Caucasian male patient seen on 04/16/2019 upon referral from Dr Karilyn Cota for a new fitting of CPAP supplies. He has a deviated septum nasi, has gained weight since his release from prison and reports using a FFM with his machine. Past medical history of Asthma, OSA, nasal septal deviation. He has a night shift job.  Chief concern according to patient:" I want to use CPAP but can't proof that I am compliant".  Summary & Diagnosis:     [name] has only mild sleep apnea and AHI of 10/h and moderate snoring at RDI 13.8/h. He has bradycardic periods, no tachycardia, no hypoxia.       Recommendations:     At this rather mild degree of OSA a CPAP intervention can still be used. I will order an autotitration capable CPAP with a setting from 5-12 cm water, with 2 cm EPR and a FFM Simplus in medium size. ( fitted here in office)  The patient may benefit form nasal septum surgery, in the meantime he uses nasal decongestants.  The patient should avoid supine sleep and attempt to obtain 6 hours of daytime sleep.  Interpreting Physician:  Melvyn Novas, MD

## 2019-05-27 NOTE — Procedures (Signed)
Patient Information     First Name: Ethan Weber" Last Name: Concannon ID: [id number]  Birth Date: [birth year] Age: 44 Gender: Male  Referring Provider: Wilson Singer, MD BMI: 30.9 (W=216 lb, H=5' 10'')  Neck Circ.:  17 '' Epworth:  12/24   Sleep Study Information    Study Date: May 18, 2019 S/H/A Version: 003.003.003.003 / 4.0.1515 / 57  History:    Ethan Weber is a 64. year old Caucasian male patient seen on 04/16/2019 upon referral from Dr Karilyn Cota for a new fitting of CPAP supplies. He has a deviated septum nasi, has gained weight since his release from prison and reports using a FFM with his machine. Past medical history of Asthma, OSA, nasal septal deviation. He has a night shift job.  Chief concern according to patient:" I want to use CPAP but can't proof that I am compliant".   Summary & Diagnosis:      Ethan Weber has only mild sleep apnea and AHI of 10/h and moderate snoring at RDI 13.8/h. He has bradycardic periods, no tachycardia, no hypoxia.       Recommendations:      At this rather mild degree of OSA a CPAP intervention can still be used. I will order an autotitration capable CPAP with a setting form 5-12 cm water, 2 cm EPR and FFM Simplus in medium size.  The patient may benefit form nasal septum surgery, in the meantime he uses nasal decongestants.  The patient should avoid supine sleep and attempt to obtain 6 hours of daytime sleep.  Interpreting Physician:  Melvyn Novas, MD             Sleep Summary  Oxygen Saturation Statistics   Start Study Time: End Study Time: Total Recording Time:       10:48:55 PM 6:41:39 AM 7 h, 52 min  Total Sleep Time % REM of Sleep Time:  7 h, 4 min  28.3    Mean: 95 Minimum: 88 Maximum: 99  Mean of Desaturations Nadirs (%):   93  Oxygen Desaturation. %: 4-9 10-20 >20 Total  Events Number Total  14 100.0  0 0.0  0 0.0  14 100.0  Oxygen Saturation: <90 <=88 <85 <80 <70  Duration (minutes): Sleep %  0.1 0.0 0.0 0.0 0.0 0.0 0.0 0.0 0.0 0.0     Respiratory Indices      Total Events REM NREM All Night  pRDI:  93  pAHI:  67 ODI:  14  pAHIc:  6  % CSR: 0.0 10.8 6.8 0.6 1.1 14.9 11.0 2.6 0.8 13.8 10.0 2.1 0.9       Pulse Rate Statistics during Sleep (BPM)      Mean:  58 Minimum: 43 Maximum: 94    Indices are calculated using technically valid sleep time of 6 h, 44 min. pRDI/pAHI are calculated using 02 desaturations ? 3%  Body Position Statistics  Position Supine Prone Right Left Non-Supine  Sleep (min) 158.2 78.5 19.0 169.0 266.5  Sleep % 37.3 18.5 4.5 39.8 62.7  pRDI 17.7 6.9 28.4 11.9 11.6  pAHI 15.2 3.9 15.8 7.4 7.0  ODI 4.5 0.8 0.0 0.7 0.7     Snoring Statistics Snoring Level (dB) >40 >50 >60 >70 >80 >Threshold (45)  Sleep (min) 210.8 7.7 1.7 0.3 0.0 43.1  Sleep % 49.6 1.8 0.4 0.1 0.0 10.1    Mean: 42 dB Sleep Stages Chart

## 2019-05-27 NOTE — Addendum Note (Signed)
Addended by: Melvyn Novas on: 05/27/2019 05:49 PM   Modules accepted: Orders

## 2019-06-02 ENCOUNTER — Telehealth: Payer: Self-pay | Admitting: Neurology

## 2019-06-02 NOTE — Telephone Encounter (Signed)
I called pt. I advised pt that Dr. Vickey Huger reviewed their sleep study results and found that pt has mild sleep apnea. Dr. Vickey Huger recommends that pt starts auto CPAP 5-12 cm water pressure. I reviewed PAP compliance expectations with the pt. Pt is agreeable to starting a CPAP. I advised pt that an order will be sent to a DME, [facility], and [facility] will call the pt within about one week after they file with the pt's insurance. [facility] will show the pt how to use the machine, fit for masks, and troubleshoot the CPAP if needed. A follow up appt was made for insurance purposes with Butch Penny, NP on April 20,2021 at 2:03 pm. Pt verbalized understanding to arrive 15 minutes early and bring their CPAP. A letter with all of this information in it will be mailed to the pt as a reminder. I verified with the pt that the address we have on file is correct. Pt verbalized understanding of results. Pt had no questions at this time but was encouraged to call back if questions arise. I have sent the order to [facility] and have received confirmation that they have received the order.

## 2019-06-02 NOTE — Telephone Encounter (Signed)
-----   Message from Melvyn Novas, MD sent at 05/27/2019  5:49 PM EST ----- Study Date: May 18, 2019 S/H/A Version: 003.003.003.003 / 4.0.1515 / 77 History:   Ethan Weber is a 39. year old Caucasian male patient seen on 04/16/2019 upon referral from Dr Karilyn Cota for a new fitting of CPAP supplies. He has a deviated septum nasi, has gained weight since his release from prison and reports using a FFM with his machine. Past medical history of Asthma, OSA, nasal septal deviation. He has a night shift job.  Chief concern according to patient:" I want to use CPAP but can't proof that I am compliant".   Summary & Diagnosis:     Ethan Weber has only mild sleep apnea and AHI of 10/h and moderate snoring at RDI 13.8/h. He has bradycardic periods, no tachycardia, no hypoxia.       Recommendations:     At this rather mild degree of OSA a CPAP intervention can still be used. I will order an autotitration capable CPAP with a setting from 5-12 cm water, with 2 cm EPR and a FFM Simplus in medium size. ( fitted here in office)  The patient may benefit form nasal septum surgery, in the meantime he uses nasal decongestants.  The patient should avoid supine sleep and attempt to obtain 6 hours of daytime sleep.  Interpreting Physician:  Melvyn Novas, MD

## 2019-06-15 ENCOUNTER — Encounter: Payer: Self-pay | Admitting: Adult Health

## 2019-06-17 ENCOUNTER — Telehealth: Payer: Self-pay | Admitting: *Deleted

## 2019-06-17 NOTE — Telephone Encounter (Signed)
Pt scheduled for cpap yesterday am by DME [facility].

## 2019-06-24 ENCOUNTER — Other Ambulatory Visit (INDEPENDENT_AMBULATORY_CARE_PROVIDER_SITE_OTHER): Payer: 59

## 2019-06-24 ENCOUNTER — Other Ambulatory Visit: Payer: Self-pay

## 2019-06-29 LAB — HEMOGLOBINOPATHY EVALUATION
Fetal Hemoglobin Testing: <1 % (ref 0.0–1.9)
HCT: 40 % (ref 38.5–50.0)
Hemoglobin A2 - HGBRFX: 4.7 % — ABNORMAL HIGH (ref 1.8–3.5)
Hemoglobin: 12.2 g/dL — ABNORMAL LOW (ref 13.2–17.1)
Hgb A: 94.3 % — ABNORMAL LOW (ref >96.0)
MCH: 19.1 pg — ABNORMAL LOW (ref 27.0–33.0)
MCV: 62.6 fL — ABNORMAL LOW (ref 80.0–100.0)
RBC: 6.39 10*6/uL — ABNORMAL HIGH (ref 4.20–5.80)
RDW: 18.2 % — ABNORMAL HIGH (ref 11.0–15.0)

## 2019-07-06 ENCOUNTER — Encounter (INDEPENDENT_AMBULATORY_CARE_PROVIDER_SITE_OTHER): Payer: Self-pay | Admitting: Nurse Practitioner

## 2019-07-06 ENCOUNTER — Telehealth (INDEPENDENT_AMBULATORY_CARE_PROVIDER_SITE_OTHER): Payer: Self-pay | Admitting: Nurse Practitioner

## 2019-07-06 NOTE — Progress Notes (Signed)
This encounter was created in error - please disregard.

## 2019-07-06 NOTE — Telephone Encounter (Signed)
Ethan Weber, I noticed this patient does not have an upcoming follow-up scheduled. Would you call him and get him scheduled for a regular office visit/follow-up appointment with either myself or Dr. Karilyn Cota for sometime in August? Thank you.

## 2019-07-06 NOTE — Telephone Encounter (Signed)
done

## 2019-07-28 ENCOUNTER — Other Ambulatory Visit: Payer: Self-pay

## 2019-07-28 ENCOUNTER — Encounter: Payer: Self-pay | Admitting: Adult Health

## 2019-07-28 ENCOUNTER — Ambulatory Visit (INDEPENDENT_AMBULATORY_CARE_PROVIDER_SITE_OTHER): Payer: 59 | Admitting: Adult Health

## 2019-07-28 VITALS — BP 106/67 | HR 66 | Ht 70.5 in | Wt 211.0 lb

## 2019-07-28 DIAGNOSIS — G4733 Obstructive sleep apnea (adult) (pediatric): Secondary | ICD-10-CM

## 2019-07-28 DIAGNOSIS — Z9989 Dependence on other enabling machines and devices: Secondary | ICD-10-CM | POA: Diagnosis not present

## 2019-07-28 NOTE — Patient Instructions (Addendum)
Continue using CPAP nightly and greater than 4 hours each night Pressure increase 5-15 If your symptoms worsen or you develop new symptoms please let us know.

## 2019-07-28 NOTE — Progress Notes (Signed)
Order for cpap pressure change sent to [facility] via community message. Confirmation received that the order transmitted was successful.  

## 2019-07-28 NOTE — Progress Notes (Signed)
PATIENT: Ethan Weber DOB: September 11, 1975  REASON FOR VISIT: follow up HISTORY FROM: patient  HISTORY OF PRESENT ILLNESS: Today 07/28/19:  Mr. Ethan Weber is a 44 year old male with a history of obstructive sleep apnea on CPAP.  His download indicates that he use his machine nightly for compliance of 100%.  He uses machine greater than 4 hours 28 days for compliance of 93%.  On average he uses his machine 7 hours and 53 minutes.  His residual AHI is 2.8 on 5 to 12 cm of water with EPR 2.  Leak in the 95th percentile is 16.5 L/min. He reports that there are times he feels that he needs more air.   HISTORY (Copied from Dr.Dohmeier's note) Ethan Weber a 43. year old White or Caucasian male patientseen here as a referralon 04/16/2019 from dr Karilyn Cota for a new fitting of supplies. He has also a deviated septum, he has gained weight since release form prison, and he reports using a FFM with his machine.  Chiefconcernaccording to patient : I want to use CPAP but can't proof that I am compliant.   I have the pleasure of seeing Ethan Weber today,a right -handed White or Caucasian male with a possible sleep disorder. he   has a past medical history of Asthma and Deviated septum.he used retainers as a teenager.   Sleeprelevant medical history: Nocturia; 1-2 , he works night shifts,  He snores very loudly, since childhood- deviated septum , weight? Familymedical /sleep history: His mother is a Snorer. She is a smoker .    Social history:Patient is working as Production assistant, radio-  and lives in a household with 3 persons- a lady friend and her son- he has 2 sons from another relationship. grandchildren.  The patient currently works in shifts( Chief Technology Officer,) Pets are not present. Tobacco use- quit 20 years ago.ETOH use liquor- on occassion.  Caffeine intake in form of Coffee( 3 cups a day) Soda( rare ) Tea (none ) or energy drinks. Regular exercise in form of work.  Hobbies : sports.   Sleep habits are as follows: he wakes at 3.30 PM and eats at 12 midnight- work ends 7 AM.  12 hour shifts. The patient goes to bed at 8 PM - in a cool, quiet and mostly dark bedroom- and continues to sleep for 3-4 hours, wakes for 0-3 bathroom breaks.  The preferred sleep position is laterally, but he turns supine-  , with the support of 1 pillow. Dreams are reportedly rare.  3.30 PM is the usual rise time. The patient wakes up spontaneously-without an alarm.  He reports not feeling refreshed or restored in AM, with symptoms such as dry mouth, morning headaches, and residual fatigue.- worse when not using CPAP-  Naps are taken in frequently.   REVIEW OF SYSTEMS: Out of a complete 14 system review of symptoms, the patient complains only of the following symptoms, and all other reviewed systems are negative.  See HPI  ALLERGIES: Allergies  Allergen Reactions  . Penicillins     HOME MEDICATIONS: Outpatient Medications Prior to Visit  Medication Sig Dispense Refill  . fluticasone (FLONASE) 50 MCG/ACT nasal spray Place 2 sprays into both nostrils daily. 16 g 6   No facility-administered medications prior to visit.    PAST MEDICAL HISTORY: Past Medical History:  Diagnosis Date  . Asthma    Childhood  . Deviated septum   . Thalassemia trait, beta     PAST SURGICAL HISTORY: Past Surgical History:  Procedure  Laterality Date  . TYMPANOSTOMY TUBE PLACEMENT Bilateral    Age 28    FAMILY HISTORY: Family History  Problem Relation Age of Onset  . ALS Father   . Heart disease Maternal Grandmother     SOCIAL HISTORY: Social History   Socioeconomic History  . Marital status: Single    Spouse name: Not on file  . Number of children: 2  . Years of education: Not on file  . Highest education level: Not on file  Occupational History  . Occupation: Glass blower/designer    Comment: [employer]  Tobacco Use  . Smoking status: Former Smoker    Packs/day:  0.50    Years: 2.00    Pack years: 1.00    Types: Cigarettes    Quit date: 04/09/1998    Years since quitting: 21.3  . Smokeless tobacco: Former Network engineer and Sexual Activity  . Alcohol use: Yes    Comment: Once a week  . Drug use: Not Currently    Comment: Marijuana in the past; quit in 1998  . Sexual activity: Not on file  Other Topics Concern  . Not on file  Social History Narrative  . Not on file   Social Determinants of Health   Financial Resource Strain:   . Difficulty of Paying Living Expenses:   Food Insecurity:   . Worried About Charity fundraiser in the Last Year:   . Arboriculturist in the Last Year:   Transportation Needs:   . Film/video editor (Medical):   Marland Kitchen Lack of Transportation (Non-Medical):   Physical Activity:   . Days of Exercise per Week:   . Minutes of Exercise per Session:   Stress:   . Feeling of Stress :   Social Connections:   . Frequency of Communication with Friends and Family:   . Frequency of Social Gatherings with Friends and Family:   . Attends Religious Services:   . Active Member of Clubs or Organizations:   . Attends Archivist Meetings:   Marland Kitchen Marital Status:   Intimate Partner Violence:   . Fear of Current or Ex-Partner:   . Emotionally Abused:   Marland Kitchen Physically Abused:   . Sexually Abused:       PHYSICAL EXAM  Vitals:   07/28/19 1417  BP: 106/67  Pulse: 66  Weight: 211 lb (95.7 kg)  Height: 5' 10.5" (1.791 m)   Body mass index is 29.85 kg/m.  Generalized: Well developed, in no acute distress  Chest: Lungs clear to auscultation bilaterally  Neurological examination  Mentation: Weber oriented to time, place, history taking. Follows all commands speech and language fluent Cranial nerve II-XII: Extraocular movements were full, visual field were full on confrontational test Head turning and shoulder shrug  were normal and symmetric. Motor: The motor testing reveals 5 over 5 strength of all 4 extremities.  Good symmetric motor tone is noted throughout.  Sensory: Sensory testing is intact to soft touch on all 4 extremities. No evidence of extinction is noted.  Gait and station: Gait is normal.    DIAGNOSTIC DATA (LABS, IMAGING, TESTING) - I reviewed patient records, labs, notes, testing and imaging myself where available.  Lab Results  Component Value Date   WBC 6.1 05/14/2019   HGB 12.2 (L) 06/24/2019   HCT 40.0 06/24/2019   MCV 62.6 (L) 06/24/2019   PLT 202 05/14/2019      Component Value Date/Time   NA 140 04/01/2019 1624   K  4.3 04/01/2019 1624   CL 103 04/01/2019 1624   CO2 26 04/01/2019 1624   GLUCOSE 93 04/01/2019 1624   BUN 14 04/01/2019 1624   CREATININE 1.18 04/01/2019 1624   CALCIUM 9.1 04/01/2019 1624   PROT 7.0 04/01/2019 1624   AST 30 04/01/2019 1624   ALT 36 04/01/2019 1624   BILITOT 1.0 04/01/2019 1624   GFRNONAA 76 04/01/2019 1624   GFRAA 88 04/01/2019 1624   Lab Results  Component Value Date   CHOL 278 (H) 04/01/2019   HDL 53 04/01/2019   LDLCALC 191 (H) 04/01/2019   TRIG 170 (H) 04/01/2019   CHOLHDL 5.2 (H) 04/01/2019   Lab Results  Component Value Date   HGBA1C 5.2 04/01/2019   No results found for: VITAMINB12 Lab Results  Component Value Date   TSH 2.37 04/01/2019      ASSESSMENT AND PLAN 44 y.o. year old male  has a past medical history of Asthma, Deviated septum, and Thalassemia trait, beta. here with:  1. OSA on CPAP  - CPAP compliance excellent - Good treatment of AHI  -We will increase pressure 5 to 15 cm of water - Encourage patient to use CPAP nightly and > 4 hours each night - F/U in 1 year or sooner if needed   I spent 20 minutes of face-to-face and non-face-to-face time with patient.  This included previsit chart review, lab review, study review, order entry, electronic health record documentation, patient education.  Butch Penny, MSN, NP-C 07/28/2019, 2:25 PM [facility] [address] [place], [place] [address] [phone]

## 2019-08-14 ENCOUNTER — Encounter (INDEPENDENT_AMBULATORY_CARE_PROVIDER_SITE_OTHER): Payer: Self-pay | Admitting: Nurse Practitioner

## 2019-08-24 ENCOUNTER — Telehealth: Payer: Self-pay

## 2019-08-24 NOTE — Telephone Encounter (Signed)
Pt called office and left a VM advising [place] that he has not heard from his DME regarding the pressure change ordered last visit. Please call [phone].

## 2019-08-24 NOTE — Telephone Encounter (Signed)
Sent another CM to [facility] to address.

## 2019-08-25 NOTE — Telephone Encounter (Signed)
RE: cpap Received: Today Message Contents  [name], RN; Regino Schultze morning,   [name] received this pressure change... which was completed on 07/29/19. Notes show that the change was made in [employer] and a voice mail was left, same day, to let patient know of changes.   Ethan Weber

## 2019-11-09 ENCOUNTER — Other Ambulatory Visit (INDEPENDENT_AMBULATORY_CARE_PROVIDER_SITE_OTHER): Payer: Self-pay | Admitting: Nurse Practitioner

## 2019-11-09 DIAGNOSIS — J310 Chronic rhinitis: Secondary | ICD-10-CM

## 2019-11-11 ENCOUNTER — Ambulatory Visit (INDEPENDENT_AMBULATORY_CARE_PROVIDER_SITE_OTHER): Payer: 59 | Admitting: Nurse Practitioner

## 2019-11-11 ENCOUNTER — Encounter (INDEPENDENT_AMBULATORY_CARE_PROVIDER_SITE_OTHER): Payer: Self-pay | Admitting: Nurse Practitioner

## 2019-11-11 ENCOUNTER — Other Ambulatory Visit: Payer: Self-pay

## 2019-11-11 VITALS — BP 120/70 | HR 56 | Temp 97.5°F | Ht 70.0 in | Wt 214.0 lb

## 2019-11-11 DIAGNOSIS — R140 Abdominal distension (gaseous): Secondary | ICD-10-CM

## 2019-11-11 DIAGNOSIS — D563 Thalassemia minor: Secondary | ICD-10-CM | POA: Diagnosis not present

## 2019-11-11 DIAGNOSIS — G4730 Sleep apnea, unspecified: Secondary | ICD-10-CM

## 2019-11-11 DIAGNOSIS — E785 Hyperlipidemia, unspecified: Secondary | ICD-10-CM

## 2019-11-11 MED ORDER — OMEPRAZOLE 20 MG PO CPDR: 20.0000 mg | DELAYED_RELEASE_CAPSULE | Freq: Every day | ORAL | 1 refills | Status: DC

## 2019-11-11 NOTE — Patient Instructions (Signed)
Gluten-Free Diet for Celiac Disease, Adult  The gluten-free diet includes all foods that do not contain gluten. Gluten is a protein that is found in wheat, rye, barley, and some other grains. Following the gluten-free diet is the only treatment for people with celiac disease. It helps to prevent damage to the intestines and improves or eliminates the symptoms of celiac disease. Following the gluten-free diet requires some planning. It can be challenging at first, but it gets easier with time and practice. There are more gluten-free options available today than ever before. If you need help finding gluten-free foods or if you have questions, talk with your diet and nutrition specialist (registered dietitian) or your health care provider. What do I need to know about a gluten-free diet?  All fruits, vegetables, and meats are safe to eat and do not contain gluten.  When grocery shopping, start by shopping in the produce, meat, and dairy sections. These sections are more likely to contain gluten-free foods. Then move to the aisles that contain packaged foods if you need to.  Read all food labels. Gluten is often added to foods. Always check the ingredient list and look for warnings, such as "may contain gluten."  Talk with your dietitian or health care provider before taking a gluten-free multivitamin or mineral supplement.  Be aware of gluten-free foods having contact with foods that contain gluten (cross-contamination). This can happen at home and with any processed foods. ? Talk with your health care provider or dietitian about how to reduce the risk of cross-contamination in your home. ? If you have questions about how a food is processed, ask the manufacturer. What key words help to identify gluten? Foods that list any of these key words on the label usually contain gluten:  Wheat, flour, enriched flour, bromated flour, white flour, durum flour, graham flour, phosphated flour, self-rising flour,  semolina, farina, barley (malt), rye, and oats.  Starch, dextrin, modified food starch, or cereal.  Thickening, fillers, or emulsifiers.  Malt flavoring, malt extract, or malt syrup.  Hydrolyzed vegetable protein. In the [place] packaged foods that are gluten-free are required to be labeled "GF." These foods should be easy to identify and are safe to eat. In the [place] food companies are also required to list common food allergens, including wheat, on their labels. Recommended foods Grains  Amaranth, bean flours, 100% buckwheat flour, corn, millet, nut flours or nut meals, GF oats, quinoa, rice, sorghum, teff, rice wafers, pure cornmeal tortillas, popcorn, and hot cereals made from cornmeal. Hominy, rice, wild rice. Some Asian rice noodles or bean noodles. Arrowroot starch, corn bran, corn flour, corn germ, cornmeal, corn starch, potato flour, potato starch flour, and rice bran. Plain, brown, and sweet rice flours. Rice polish, soy flour, and tapioca starch. Vegetables  All plain fresh, frozen, and canned vegetables. Fruits  All plain fresh, frozen, canned, and dried fruits, and 100% fruit juices. Meats and other protein foods  All fresh beef, pork, poultry, fish, seafood, and eggs. Fish canned in water, oil, brine, or vegetable broth. Plain nuts and seeds, peanut butter. Some lunch meat and some frankfurters. Dried beans, dried peas, and lentils. Dairy  Fresh plain, dry, evaporated, or condensed milk. Cream, butter, sour cream, whipping cream, and most yogurts. Unprocessed cheese, most processed cheeses, some cottage cheese, some cream cheeses. Beverages  Coffee, tea, most herbal teas. Carbonated beverages and some root beers. Wine, sake, and distilled spirits, such as gin, vodka, and whiskey. Most hard ciders. Fats and oils  Butter,   margarine, vegetable oil, hydrogenated butter, olive oil, shortening, lard, cream, and some mayonnaise. Some commercial salad dressings. Olives. Sweets and  desserts  Sugar, honey, some syrups, molasses, jelly, and jam. Plain hard candy, marshmallows, and gumdrops. Pure cocoa powder. Plain chocolate. Custard and some pudding mixes. Gelatin desserts, sorbets, frozen ice pops, and sherbet. Cake, cookies, and other desserts prepared with allowed flours. Some commercial ice creams. Cornstarch, tapioca, and rice puddings. Seasoning and other foods  Some canned or frozen soups. Monosodium glutamate (MSG). Cider, rice, and wine vinegar. Baking soda and baking powder. Cream of tartar. Baking and nutritional yeast. Certain soy sauces made without wheat (ask your dietitian about specific brands that are allowed). Nuts, coconut, and chocolate. Salt, pepper, herbs, spices, flavoring extracts, imitation or artificial flavorings, natural flavorings, and food colorings. Some medicines and supplements. Some lip glosses and other cosmetics. Rice syrups. The items listed may not be a complete list. Talk with your dietitian about what dietary choices are best for you. Foods to avoid Grains  Barley, bran, bulgur, couscous, cracked wheat, [place], farro, graham, malt, matzo, semolina, wheat germ, and all wheat and rye cereals including spelt and kamut. Cereals containing malt as a flavoring, such as rice cereal. Noodles, spaghetti, macaroni, most packaged rice mixes, and all mixes containing wheat, rye, barley, or triticale. Vegetables  Most creamed vegetables and most vegetables canned in sauces. Some commercially prepared vegetables and salads. Fruits  Thickened or prepared fruits and some pie fillings. Some fruit snacks and fruit roll-ups. Meats and other protein foods  Any meat or meat alternative containing wheat, rye, barley, or gluten stabilizers. These are often marinated or packaged meats and lunch meats. Bread-containing products, such as Swiss steak, croquettes, meatballs, and meatloaf. Most tuna canned in vegetable broth and turkey with hydrolyzed vegetable  protein (HVP) injected as part of the basting. Seitan. Imitation fish. Eggs in sauces made from ingredients to avoid. Dairy  Commercial chocolate milk drinks and malted milk. Some non-dairy creamers. Any cheese product containing ingredients to avoid. Beverages  Certain cereal beverages. Beer, ale, malted milk, and some root beers. Some hard ciders. Some instant flavored coffees. Some herbal teas made with barley or with barley malt added. Fats and oils  Some commercial salad dressings. Sour cream containing modified food starch. Sweets and desserts  Some toffees. Chocolate-coated nuts (may be rolled in wheat flour) and some commercial candies and candy bars. Most cakes, cookies, donuts, pastries, and other baked goods. Some commercial ice cream. Ice cream cones. Commercially prepared mixes for cakes, cookies, and other desserts. Bread pudding and other puddings thickened with flour. Products containing brown rice syrup made with barley malt enzyme. Desserts and sweets made with malt flavoring. Seasoning and other foods  Some curry powders, some dry seasoning mixes, some gravy extracts, some meat sauces, some ketchups, some prepared mustards, and horseradish. Certain soy sauces. Malt vinegar. Bouillon and bouillon cubes that contain HVP. Some chip dips, and some chewing gum. Yeast extract. Brewer's yeast. Caramel color. Some medicines and supplements. Some lip glosses and other cosmetics. The items listed may not be a complete list. Talk with your dietitian about what dietary choices are best for you. Summary  Gluten is a protein that is found in wheat, rye, barley, and some other grains. The gluten-free diet includes all foods that do not contain gluten.  If you need help finding gluten-free foods or if you have questions, talk with your diet and nutrition specialist (registered dietitian) or your health care provider.  Read   all food labels. Gluten is often added to foods. Always check the  ingredient list and look for warnings, such as "may contain gluten." This information is not intended to replace advice given to you by your health care provider. Make sure you discuss any questions you have with your health care provider. Document Revised: 03/08/2017 Document Reviewed: 01/09/2016 Elsevier Patient Education  2020 Elsevier Inc.  

## 2019-11-11 NOTE — Progress Notes (Signed)
Subjective:  Patient ID: Ethan Weber, male    DOB: Apr 18, 1975  Age: 44 y.o. MRN: [id number]  CC:  Chief Complaint  Patient presents with  . Anemia  . Bloated  . Hyperlipidemia  . Follow-up    sleep apnea      HPI  This patient arrives today for the above.  Anemia: He was found to have thalassemia trait, beta in previous office visit.  Anemia has been stable and he remains mostly asymptomatic.  He does have some fatigue, but he also works night shift and does not sleep many hours per day.  Bloating: He tells me he has been experiencing some abdominal bloating over the last few months.  When asked to quantify he is not sure exactly when the symptoms started but thinks it has been less than 1 year.  He denies abdominal pain, family history of colon cancer, unexplained weight loss, unexplained fevers, significant changes to bowel characteristics/habits, blood in his stool.  He tells me he has a bowel movement every single day, sometimes his bowel movement will be looser than others but denies episodes of overt diarrhea.  He has not noticed any patterns associated with the bloating, usually going to sleep will result in the bloating resolving by the time he wakes up.  He works in [employer] and is pretty active most of the day.  Hyperlipidemia: He has a history of hyperlipidemia and last LDL was greater than 190.  He is not currently on any medications to treat his cholesterol.  Sleep apnea: He does use his CPAP machine, and reports using it on a regular basis.  He follows up with neurology for this.   Past Medical History:  Diagnosis Date  . Asthma    Childhood  . Deviated septum   . Thalassemia trait, beta       Family History  Problem Relation Age of Onset  . ALS Father   . Heart disease Maternal Grandmother     Social History   Social History Narrative  . Not on file   Social History   Tobacco Use  . Smoking status: Former Smoker    Packs/day: 0.50     Years: 2.00    Pack years: 1.00    Types: Cigarettes    Quit date: 04/09/1998    Years since quitting: 21.6  . Smokeless tobacco: Former Network engineer Use Topics  . Alcohol use: Yes    Comment: Once a week     Current Meds  Medication Sig  . fluticasone (FLONASE) 50 MCG/ACT nasal spray SPRAY 2 SPRAYS INTO EACH NOSTRIL EVERY DAY    ROS:  Review of Systems  Constitutional: Positive for malaise/fatigue. Negative for fever and weight loss.  Respiratory: Negative for shortness of breath.   Cardiovascular: Negative for chest pain and palpitations.  Gastrointestinal: Negative for abdominal pain, blood in stool, constipation, diarrhea, heartburn, nausea and vomiting.       (+) bloating  Neurological: Negative for dizziness.     Objective:   Today's Vitals: BP 120/70 (BP Location: Left Arm, Patient Position: Sitting, Cuff Size: Normal)   Pulse (!) 56   Temp (!) 97.5 F (36.4 C) (Temporal)   Ht 5' 10"  (1.778 m)   Wt 214 lb (97.1 kg)   SpO2 99%   BMI 30.71 kg/m  Vitals with BMI 11/11/2019 07/28/2019 05/14/2019  Height 5' 10"  5' 10.5" 5' 10"   Weight 214 lbs 211 lbs 211 lbs 10 oz  BMI 30.71  46.65 99.35  Systolic 701 779 390  Diastolic 70 67 80  Pulse 56 66 64     Physical Exam Vitals reviewed.  Constitutional:      Appearance: Normal appearance.  HENT:     Head: Normocephalic and atraumatic.  Cardiovascular:     Rate and Rhythm: Normal rate and regular rhythm.  Pulmonary:     Effort: Pulmonary effort is normal.     Breath sounds: Normal breath sounds.  Abdominal:     General: Abdomen is flat. Bowel sounds are normal.     Palpations: Abdomen is soft. There is no mass.     Tenderness: There is no abdominal tenderness.  Musculoskeletal:     Cervical back: Neck supple.  Skin:    General: Skin is warm and dry.  Neurological:     Mental Status: He is alert and oriented to person, place, and time.  Psychiatric:        Mood and Affect: Mood normal.        Behavior:  Behavior normal.        Thought Content: Thought content normal.        Judgment: Judgment normal.          Assessment and Plan   1. Hyperlipidemia, unspecified hyperlipidemia type   2. Thalassemia trait   3. Bloating   4. Sleep apnea, unspecified type      Plan: 1.  He is fasting today so we will collect lipid panel for further evaluation.  If LDL remains greater than 190 will need to consider starting him on medication to control his cholesterol.  I did explain this to him today, and he will think about this while waiting for blood work results. 2.  We will check CBC to make sure anemia is stable. 3.  Not sure of current etiology, I do not note any significant red flag symptoms that would make especially suspicious for cancer.  For now we will start by treating him with antacid to see if this helps his symptoms, I also encouraged him to consider avoiding gluten in his diet in case he has a mild intolerance to it.  He will return in 6 weeks and if his symptoms persist may need to consider referral to GI.  He tells me he understands. 4.  He will continue using his CPAP and follow-up with neurology as scheduled.   Tests ordered Orders Placed This Encounter  Procedures  . CMP with eGFR([facility])  . Lipid Panel  . CBC      Meds ordered this encounter  Medications  . omeprazole (PRILOSEC) 20 MG capsule    Sig: Take 1 capsule (20 mg total) by mouth daily.    Dispense:  30 capsule    Refill:  1    Order Specific Question:   Supervising Provider    Answer:   Fayrene Helper [[id number]]    Patient to follow-up in 6 weeks  Ailene Ards, NP

## 2019-11-12 LAB — CBC
HCT: 41.3 % (ref 38.5–50.0)
Hemoglobin: 12.5 g/dL — ABNORMAL LOW (ref 13.2–17.1)
MCH: 18.8 pg — ABNORMAL LOW (ref 27.0–33.0)
MCHC: 30.3 g/dL — ABNORMAL LOW (ref 32.0–36.0)
MCV: 62.1 fL — ABNORMAL LOW (ref 80.0–100.0)
Platelets: 209 10*3/uL (ref 140–400)
RBC: 6.65 10*6/uL — ABNORMAL HIGH (ref 4.20–5.80)
RDW: 18.3 % — ABNORMAL HIGH (ref 11.0–15.0)
WBC: 5.4 10*3/uL (ref 3.8–10.8)

## 2019-11-12 LAB — COMPLETE METABOLIC PANEL WITH GFR
AG Ratio: 1.8 (calc) (ref 1.0–2.5)
ALT: 37 U/L (ref 9–46)
AST: 30 U/L (ref 10–40)
Albumin: 4.6 g/dL (ref 3.6–5.1)
Alkaline phosphatase (APISO): 55 U/L (ref 36–130)
BUN: 16 mg/dL (ref 7–25)
CO2: 30 mmol/L (ref 20–32)
Calcium: 9.5 mg/dL (ref 8.6–10.3)
Chloride: 102 mmol/L (ref 98–110)
Creat: 1.18 mg/dL (ref 0.60–1.35)
GFR, Est African American: 88 mL/min/{1.73_m2} (ref >=60)
GFR, Est Non African American: 76 mL/min/{1.73_m2} (ref >=60)
Globulin: 2.6 g/dL (calc) (ref 1.9–3.7)
Glucose, Bld: 89 mg/dL (ref 65–99)
Potassium: 4.5 mmol/L (ref 3.5–5.3)
Sodium: 138 mmol/L (ref 135–146)
Total Bilirubin: 0.9 mg/dL (ref 0.2–1.2)
Total Protein: 7.2 g/dL (ref 6.1–8.1)

## 2019-11-12 LAB — LIPID PANEL
Cholesterol: 257 mg/dL — ABNORMAL HIGH (ref <200)
HDL: 49 mg/dL (ref >=40)
LDL Cholesterol (Calc): 167 mg/dL (calc) — ABNORMAL HIGH
Non-HDL Cholesterol (Calc): 208 mg/dL (calc) — ABNORMAL HIGH (ref <130)
Total CHOL/HDL Ratio: 5.2 (calc) — ABNORMAL HIGH (ref <5.0)
Triglycerides: 249 mg/dL — ABNORMAL HIGH (ref <150)

## 2019-12-05 ENCOUNTER — Other Ambulatory Visit (INDEPENDENT_AMBULATORY_CARE_PROVIDER_SITE_OTHER): Payer: Self-pay | Admitting: Nurse Practitioner

## 2019-12-05 DIAGNOSIS — R140 Abdominal distension (gaseous): Secondary | ICD-10-CM

## 2019-12-21 ENCOUNTER — Other Ambulatory Visit (INDEPENDENT_AMBULATORY_CARE_PROVIDER_SITE_OTHER): Payer: Self-pay | Admitting: Internal Medicine

## 2019-12-21 DIAGNOSIS — R140 Abdominal distension (gaseous): Secondary | ICD-10-CM

## 2019-12-29 ENCOUNTER — Encounter (INDEPENDENT_AMBULATORY_CARE_PROVIDER_SITE_OTHER): Payer: Self-pay | Admitting: Nurse Practitioner

## 2019-12-31 ENCOUNTER — Encounter (INDEPENDENT_AMBULATORY_CARE_PROVIDER_SITE_OTHER): Payer: Self-pay | Admitting: Internal Medicine

## 2019-12-31 ENCOUNTER — Ambulatory Visit (INDEPENDENT_AMBULATORY_CARE_PROVIDER_SITE_OTHER): Payer: 59 | Admitting: Internal Medicine

## 2019-12-31 ENCOUNTER — Other Ambulatory Visit: Payer: Self-pay

## 2019-12-31 VITALS — BP 137/80 | HR 78 | Temp 97.8°F | Resp 19 | Ht 70.0 in | Wt 218.0 lb

## 2019-12-31 DIAGNOSIS — E785 Hyperlipidemia, unspecified: Secondary | ICD-10-CM | POA: Diagnosis not present

## 2019-12-31 NOTE — Progress Notes (Signed)
Metrics: Intervention Frequency ACO  Documented Smoking Status Yearly  Screened one or more times in 24 months  Cessation Counseling or  Active cessation medication Past 24 months  Past 24 months   Guideline developer: UpToDate (See UpToDate for funding source) Date Released: 2014       Wellness Office Visit  Subjective:  Patient ID: Ethan Weber, male    DOB: [birth year]  Age: 44 y.o. MRN: [id number]  CC: This man comes in for follow-up of hyperlipidemia. HPI His total cholesterol level has actually improved since the very first time he was seen.  He has been trying to follow dietary recommendations by Maralyn Sago. Past Medical History:  Diagnosis Date  . Asthma    Childhood  . Deviated septum   . Thalassemia trait, beta    Past Surgical History:  Procedure Laterality Date  . TYMPANOSTOMY TUBE PLACEMENT Bilateral    Age 73     Family History  Problem Relation Age of Onset  . ALS Father   . 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   Social History   Tobacco Use  . Smoking status: Former Smoker    Packs/day: 0.50    Years: 2.00    Pack years: 1.00    Types: Cigarettes    Quit date: 04/09/1998    Years since quitting: 21.7  . Smokeless tobacco: Former Engineer, water Use Topics  . Alcohol use: Yes    Comment: Once a week    Current Meds  Medication Sig  . fluticasone (FLONASE) 50 MCG/ACT nasal spray SPRAY 2 SPRAYS INTO EACH NOSTRIL EVERY DAY  . omeprazole (PRILOSEC) 20 MG capsule TAKE 1 CAPSULE BY MOUTH EVERY DAY      Depression screen [facility] 2/9 03/30/2019  Decreased Interest 0  Down, Depressed, Hopeless 0  PHQ - 2 Score 0     Objective:   Today's Vitals: BP 137/80   Pulse 78   Temp 97.8 F (36.6 C) (Temporal)   Resp 19   Ht 5\' 10"  (1.778 m)   Wt 218 lb (98.9 kg)   SpO2 98%   BMI 31.28 kg/m    Vitals with BMI 12/31/2019 11/11/2019 07/28/2019  Height 5\' 10"  5\' 10"  5' 10.5"  Weight 218 lbs 214 lbs 211 lbs  BMI 31.28 30.71 29.84  Systolic 137 120 07/30/2019  Diastolic 80 70 67  Pulse 78 56 66     Physical Exam  He looks systemically well, remains obese.     Assessment   1. Hyperlipidemia, unspecified hyperlipidemia type       Tests ordered No orders of the defined types were placed in this encounter.    Plan: 1. I discussed nutrition again with him and the concept of intermittent fasting of at least 16 hours every day combined with a plant-based diet.  I discussed the blue zones.  I recommended that he reduce animal protein intake and focus on plant-based diet, incorporating nuts and beans on a daily basis.  He will try to do this. 2. I will see him in about 3 months time and repeat blood work then.   No orders of the defined types were placed in this encounter.   , MD

## 2020-02-02 ENCOUNTER — Encounter (INDEPENDENT_AMBULATORY_CARE_PROVIDER_SITE_OTHER): Payer: Self-pay | Admitting: Nurse Practitioner

## 2020-03-22 ENCOUNTER — Encounter (INDEPENDENT_AMBULATORY_CARE_PROVIDER_SITE_OTHER): Payer: Self-pay | Admitting: Internal Medicine

## 2020-03-22 ENCOUNTER — Other Ambulatory Visit: Payer: Self-pay

## 2020-03-22 ENCOUNTER — Ambulatory Visit (INDEPENDENT_AMBULATORY_CARE_PROVIDER_SITE_OTHER): Payer: 59 | Admitting: Internal Medicine

## 2020-03-22 VITALS — BP 116/78 | HR 59 | Temp 97.3°F | Ht 69.5 in | Wt 215.2 lb

## 2020-03-22 DIAGNOSIS — E785 Hyperlipidemia, unspecified: Secondary | ICD-10-CM

## 2020-03-22 NOTE — Progress Notes (Signed)
Metrics: Intervention Frequency ACO  Documented Smoking Status Yearly  Screened one or more times in 24 months  Cessation Counseling or  Active cessation medication Past 24 months  Past 24 months   Guideline developer: UpToDate (See UpToDate for funding source) Date Released: 2014       Wellness Office Visit  Subjective:  Patient ID: Ethan Weber, male    DOB: 07-04-1975  Age: 44 y.o. MRN: [id number]  CC: This man comes in for follow-up of hyperlipidemia. HPI  He has been trying hard to work on his diet to reduce his total cholesterol.  He has been focusing more on a plant-based diet now.  He naturally does intermittent fasting based on his work schedule. Past Medical History:  Diagnosis Date  . Asthma    Childhood  . Deviated septum   . Thalassemia trait, beta    Past Surgical History:  Procedure Laterality Date  . TYMPANOSTOMY TUBE PLACEMENT Bilateral    Age 43     Family History  Problem Relation Age of Onset  . ALS Father   . 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   Social History   Tobacco Use  . Smoking status: Former Smoker    Packs/day: 0.50    Years: 2.00    Pack years: 1.00    Types: Cigarettes    Quit date: 04/09/1998    Years since quitting: 21.9  . Smokeless tobacco: Former Engineer, water Use Topics  . Alcohol use: Yes    Comment: Once a week    Current Meds  Medication Sig  . fluticasone (FLONASE) 50 MCG/ACT nasal spray SPRAY 2 SPRAYS INTO EACH NOSTRIL EVERY DAY      Depression screen [facility] 2/9 03/22/2020 03/30/2019  Decreased Interest 0 0  Down, Depressed, Hopeless 0 0  PHQ - 2 Score 0 0  Altered sleeping 0 -  Tired, decreased energy 0 -  Change in appetite 0 -  Feeling bad or failure about yourself  0 -  Trouble concentrating 0 -  Moving slowly or fidgety/restless 0 -   Suicidal thoughts 0 -  PHQ-9 Score 0 -  Difficult doing work/chores Not difficult at all -     Objective:   Today's Vitals: BP 116/78   Pulse (!) 59   Temp (!) 97.3 F (36.3 C) (Temporal)   Ht 5' 9.5" (1.765 m)   Wt 215 lb 3.2 oz (97.6 kg)   SpO2 98%   BMI 31.32 kg/m  Vitals with BMI 03/22/2020 12/31/2019 11/11/2019  Height 5' 9.5" 5\' 10"  5\' 10"   Weight 215 lbs 3 oz 218 lbs 214 lbs  BMI 31.33 31.28 30.71  Systolic 116 137  Diastolic 78 80 70  Pulse 59 78 56     Physical Exam   Although he remains obese, he has lost 3 pounds since the last visit.    Assessment   1. Hyperlipidemia, unspecified hyperlipidemia type       Tests ordered Orders Placed This Encounter  Procedures  . Lipid panel     Plan: 1. We will check a lipid panel today.  He will continue to work on nutrition.  Further recommendations will depend on these results. 2. Follow-up with Sarah in 3 months time   No orders of the defined types were placed in this encounter.  Doree Albee, MD

## 2020-03-23 LAB — LIPID PANEL
Cholesterol: 264 mg/dL — ABNORMAL HIGH (ref <200)
HDL: 45 mg/dL (ref >=40)
LDL Cholesterol (Calc): 178 mg/dL (calc) — ABNORMAL HIGH
Non-HDL Cholesterol (Calc): 219 mg/dL (calc) — ABNORMAL HIGH (ref <130)
Total CHOL/HDL Ratio: 5.9 (calc) — ABNORMAL HIGH (ref <5.0)
Triglycerides: 251 mg/dL — ABNORMAL HIGH (ref <150)

## 2020-03-30 ENCOUNTER — Encounter (INDEPENDENT_AMBULATORY_CARE_PROVIDER_SITE_OTHER): Payer: 59 | Admitting: Internal Medicine

## 2020-04-19 DIAGNOSIS — G4733 Obstructive sleep apnea (adult) (pediatric): Secondary | ICD-10-CM | POA: Diagnosis not present

## 2020-04-26 ENCOUNTER — Other Ambulatory Visit: Payer: 59

## 2020-04-29 ENCOUNTER — Other Ambulatory Visit: Payer: Self-pay

## 2020-05-03 ENCOUNTER — Other Ambulatory Visit: Payer: Self-pay

## 2020-05-03 DIAGNOSIS — Z20822 Contact with and (suspected) exposure to covid-19: Secondary | ICD-10-CM

## 2020-05-04 LAB — NOVEL CORONAVIRUS, NAA: SARS-CoV-2, NAA: NOT DETECTED

## 2020-05-04 LAB — SARS-COV-2, NAA 2 DAY TAT

## 2020-05-10 ENCOUNTER — Other Ambulatory Visit: Payer: BC Managed Care – PPO

## 2020-05-10 DIAGNOSIS — Z20822 Contact with and (suspected) exposure to covid-19: Secondary | ICD-10-CM

## 2020-05-10 DIAGNOSIS — G4733 Obstructive sleep apnea (adult) (pediatric): Secondary | ICD-10-CM | POA: Diagnosis not present

## 2020-05-11 LAB — SARS-COV-2, NAA 2 DAY TAT

## 2020-05-11 LAB — NOVEL CORONAVIRUS, NAA: SARS-CoV-2, NAA: NOT DETECTED

## 2020-05-11 LAB — SPECIMEN STATUS REPORT

## 2020-07-21 ENCOUNTER — Telehealth: Payer: Self-pay | Admitting: Adult Health

## 2020-07-21 NOTE — Telephone Encounter (Signed)
Aundra Millet will be out 4/21. LVM for pt to call back to r/s. There is a spot held for him with [facility] 4/20 at 9am.

## 2020-07-25 ENCOUNTER — Encounter: Payer: Self-pay | Admitting: Neurology

## 2020-07-26 ENCOUNTER — Ambulatory Visit (INDEPENDENT_AMBULATORY_CARE_PROVIDER_SITE_OTHER): Payer: BC Managed Care – PPO | Admitting: Neurology

## 2020-07-26 ENCOUNTER — Other Ambulatory Visit: Payer: Self-pay

## 2020-07-26 ENCOUNTER — Encounter: Payer: Self-pay | Admitting: Neurology

## 2020-07-26 VITALS — BP 130/72 | HR 79 | Ht 70.0 in | Wt 223.0 lb

## 2020-07-26 DIAGNOSIS — E669 Obesity, unspecified: Secondary | ICD-10-CM

## 2020-07-26 DIAGNOSIS — Z9989 Dependence on other enabling machines and devices: Secondary | ICD-10-CM

## 2020-07-26 DIAGNOSIS — J300 Vasomotor rhinitis: Secondary | ICD-10-CM

## 2020-07-26 DIAGNOSIS — G4733 Obstructive sleep apnea (adult) (pediatric): Secondary | ICD-10-CM

## 2020-07-26 DIAGNOSIS — G4726 Circadian rhythm sleep disorder, shift work type: Secondary | ICD-10-CM | POA: Diagnosis not present

## 2020-07-26 NOTE — Patient Instructions (Signed)

## 2020-07-26 NOTE — Progress Notes (Signed)
SLEEP MEDICINE CLINIC    Provider:  Melvyn Novas, MD  Primary Care Physician:  Wilson Singer, MD [address] [place] [address]     Referring Provider: Wilson Singer, Md [address] [place],  [place] [address]          Chief Complaint according to patient   Patient presents with:    . New Patient (Initial Visit)     pt alone, states that while he was incarcerated until 12-2017 , he had a sleep study around 2015, he doesn't remember getting the results but was set up with the machine in prison  in 2016. since he has been out he has been using the machine regularly , but is unable to get supplies. He states he is unsure if the pressure is correctly set, because pressure feel like just blowing a lot of air. He can tell a difference when he is not using the machine.      HISTORY OF PRESENT ILLNESS: 07/26/2020  Alen Matheson is a 45. year old  male patient and seen in RV on 07-26-2020: Mr. Kochan was seen here in a consultation originally on 04-16-2019 followed by a sleep study in February of this year.  The sleep study confirmed that he still should use CPAP and he had been in experience CPAP user before.  He has a history of remote tobacco use and has felt that he slept better with CPAP than without. Mr. Yasmin " Link Snuffer" Gerst has only mild sleep apnea and AHI of 10/h and moderate snoring at RDI 13.8/h. He has bradycardic periods, no tachycardia, no hypoxia. 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    His current CPAP compliance is excellent at 100% he is using an auto set between 5 and 15 cm water pressure is 2 cm expiratory relief.  His 95th percentile pressure needed is 14.6 cmH2O with an AHI of 1.4 there are no central apneas arising.  He has sometimes air leaks but the 95th percentile is not high at 8.3 L so overall this is a very good outcome with high compliance and documented effectivity.   The Epworth sleepiness score was endorsed  today at 7 points.     He was alst seen here as a referral from dr Karilyn Cota  for a new fitting of supplies. He has also a deviated septum, he has gained weight since release form prison, and he reports using a FFM with his machine.   Chief concern according to patient :  I want to use CPAP but can't proof that I am compliant.    I have the pleasure of seeing Brennyn Haisley today, a right -handed White or Caucasian male with a possible sleep disorder.  he   has a past medical history of Asthma, Deviated septum, and Thalassemia trait, beta.he used retainers as a teenager.    Sleep relevant medical history: Nocturia; 1-2 , he works night shifts,  He snores very loudly, since childhood- deviated septum , weight?  Family medical /sleep history:  His mother is a Snorer. She is a smoker .     Social history:  Patient is working as Production assistant, radio-  and lives in a household with 3 persons- a lady friend and her son- he has 2 sons from another relationship. grandchildren.  The patient currently works in shifts( Chief Technology Officer,) Pets are not present. Tobacco use- quit 20 years ago.ETOH use liquor-  on occassion.  Caffeine intake in form of Coffee( 3 cups a day) Soda( rare ) Tea (none ) or energy drinks. Regular exercise in form of work. Hobbies : sports.   Sleep habits are as follows:  he wakes at 3.30 PM and eats at 12 midnight- work ends 7 AM.  12 hour shifts. The patient goes to bed at 8 PM - in a cool, quiet and mostly dark bedroom- and continues to sleep for 3-4 hours, wakes for 0-3 bathroom breaks.  The preferred sleep position is laterally, but he turns supine-  , with the support of 1 pillow. Dreams are reportedly rare.  3.30 PM is the usual rise time. The patient wakes up spontaneously-without an alarm.  He reports not feeling refreshed or restored in AM, with symptoms such as dry mouth, morning headaches, and residual fatigue.- worse when not using CPAP-  Naps are taken in  frequently.   Review of Systems: Out of a complete 14 system review, the patient complains of only the following symptoms, and all other reviewed systems are negative.:  Fatigue, sleepiness , snoring, fragmented sleep, shift work sleep disorder.   How likely are you to doze in the following situations: 0 = not likely, 1 = slight chance, 2 = moderate chance, 3 = high chance   Sitting and Reading? Watching Television? Sitting inactive in a public place (theater or meeting)? As a passenger in a car for an hour without a break? Lying down in the afternoon when circumstances permit? Sitting and talking to someone? Sitting quietly after lunch without alcohol? In a car, while stopped for a few minutes in traffic?   Total = 10-12/ 24 points   FSS endorsed at 22/ 63 points.   Social History   Socioeconomic History  . Marital status: Single    Spouse name: Not on file  . Number of children: 2  . Years of education: Not on file  . Highest education level: Not on file  Occupational History  . Occupation: Location manager    Comment: [employer]  Tobacco Use  . Smoking status: Former Smoker    Packs/day: 0.50    Years: 2.00    Pack years: 1.00    Types: Cigarettes    Quit date: 04/09/1998    Years since quitting: 22.3  . Smokeless tobacco: Former Clinical biochemist  . Vaping Use: Never used  Substance and Sexual Activity  . Alcohol use: Yes    Comment: Once a week  . Drug use: Not Currently    Comment: Marijuana in the past; quit in 1998  . Sexual activity: Not on file  Other Topics Concern  . 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   Social Determinants of Health   Financial Resource Strain: Not on file  Food Insecurity: Not on file  Transportation Needs: Not on file  Physical Activity: Not on file  Stress: Not on file  Social Connections: Not on file     Family History  Problem Relation Age of Onset  . ALS Father   . Heart disease Maternal Grandmother     Past Medical History:  Diagnosis Date  . Asthma    Childhood  . Deviated septum   . Thalassemia trait, beta     Past Surgical History:  Procedure Laterality Date  . TYMPANOSTOMY TUBE PLACEMENT Bilateral    Age 45  Current Outpatient Medications on File Prior to Visit  Medication Sig Dispense Refill  . 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    . fluticasone (FLONASE) 50 MCG/ACT nasal spray SPRAY 2 SPRAYS INTO EACH NOSTRIL EVERY DAY (Patient taking differently: Place 1-2 sprays into both nostrils daily as needed.) 48 mL 2  . loratadine (CLARITIN) 10 MG tablet Take 10 mg by mouth daily as needed for allergies.     No current facility-administered medications on file prior to visit.    Allergies  Allergen Reactions  . Penicillins     Physical exam:  Today's Vitals   07/26/20 1326  BP: 130/72  Pulse: 79  Weight: 223 lb (101.2 kg)  Height: 5\' 10"  (1.778 m)   Body mass index is 32 kg/m.   Wt Readings from Last 3 Encounters:  07/26/20 223 lb (101.2 kg)  03/22/20 215 lb 3.2 oz (97.6 kg)  12/31/19 218 lb (98.9 kg)     Ht Readings from Last 3 Encounters:  07/26/20 5\' 10"  (1.778 m)  03/22/20 5' 9.5" (1.765 m)  12/31/19 5\' 10"  (1.778 m)      General: The patient is awake, alert and appears not in acute distress. The patient is well groomed. Head: Normocephalic, atraumatic. Neck is supple. Mallampati 2,  neck circumference: 17. 5  inches . Nasal airflow not  patent.   Retrognathia is not seen.  Cardiovascular:  Regular rate and cardiac rhythm by pulse,  without distended neck veins. Respiratory: Lungs are clear to auscultation.  Skin:  Without evidence of ankle edema, or rash. Trunk: The patient's posture is erect.   Neurologic exam  : The patient is awake and alert, oriented to place and time.   Memory subjective described as intact.  Attention span & concentration ability appears normal.  Speech is fluent,  without  dysarthria, dysphonia or aphasia.  Mood and affect are appropriate.   Cranial nerves: no loss of smell or taste reported  Pupils are equal and briskly reactive to light. Funduscopic exam deferred. .  Extraocular movements in vertical and horizontal planes were intact and without nystagmus. No Diplopia. Visual fields by finger perimetry are intact. Hearing was intact to soft voice and finger rubbing.   Facial sensation reported  intact.  Facial motor strength is symmetric and tongue and uvula move midline.  Neck ROM : rotation, tilt and flexion extension were normal for age and shoulder shrug was symmetrical.    Motor exam:  Symmetric bulk, tone and ROM.   Normal tone without cog wheeling, symmetric grip strength .   Sensory:  Fine touch, pinprick and vibration were normal.  Proprioception tested in the upper extremities was normal.   Coordination: Rapid alternating movements in the fingers/hands were of normal speed.  The Finger-to-nose maneuver was intact without evidence of ataxia, dysmetria or tremor.   Gait and station: Patient could rise unassisted from a seated position, walked without assistive device.  Stance is of normal width/ base and the patient turned with 3 steps- observation by RN .  Toe and heel walk were deferred.  Deep tendon reflexes: in the  upper and lower extremities are symmetric and intact.  Babinski response was deferred.      Mr. 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  He has gained some weight over the last year, and he has used a CPAP initially not compliantly for about 4-1/2 years.   Over the last  year he has noted that the CPAP helps him sleep better and his sleep is of a higher quality more restorative and refreshing, he is not snoring is loud so he also uses CPAP out of consideration for his bed partner.  He has chronic nasal congestion and is known to have a severe septal deviation.  He has used nasal spray he is a former smoker but has not smoked in 20 years now.  He is normocephalic and atraumatic and at a height of 5 foot 10 inches weighs 216 pounds.  Blood pressure has been normal heart rate is regular.     After spending a total time of  20 minutes face to face and additional time for physical and neurologic examination, review of laboratory studies,  personal review of imaging studies, reports and results of other testing and review of referral information / records as far as provided in visit, I have established the following assessments:   OSA on new  auto pap with FFM, and with very high compliance.   Night shift worker, Barrister's clerk.   No more headaches on CPAP.     My Plan is to proceed with:  1) after Donation of a SIMPLUS FFM in medium from [facility]/he will now get the mask prescriped.   continue using the current machine.  2) Shift work sleep disorder. Explaining hypersomnia.   3) nasal septal deviation and rhinitis. continue nasal spray.   I would like to thank Wilson Singer, Md [address] [place],  [place] [address] for allowing me to meet with and to take care of this pleasant patient.    I plan to follow up  through our NP within 12 month.   CC: I will share my notes with PCP .  Electronically signed by: Melvyn Novas, MD 07/26/2020 1:57 PM  [facility] Board certified by The [employer] of Sleep Medicine and Diplomate of the [employer] of Sleep Medicine. Board certified In Neurology through the ABPN, Fellow of the [employer] of Neurology. Medical Director of [employer].

## 2020-07-27 ENCOUNTER — Other Ambulatory Visit: Payer: Self-pay

## 2020-07-27 ENCOUNTER — Ambulatory Visit (INDEPENDENT_AMBULATORY_CARE_PROVIDER_SITE_OTHER): Payer: BC Managed Care – PPO | Admitting: Nurse Practitioner

## 2020-07-27 ENCOUNTER — Encounter (INDEPENDENT_AMBULATORY_CARE_PROVIDER_SITE_OTHER): Payer: Self-pay | Admitting: Nurse Practitioner

## 2020-07-27 VITALS — BP 120/78 | HR 65 | Temp 97.9°F | Ht 70.0 in | Wt 222.4 lb

## 2020-07-27 DIAGNOSIS — E785 Hyperlipidemia, unspecified: Secondary | ICD-10-CM

## 2020-07-27 DIAGNOSIS — G4733 Obstructive sleep apnea (adult) (pediatric): Secondary | ICD-10-CM | POA: Diagnosis not present

## 2020-07-27 DIAGNOSIS — D563 Thalassemia minor: Secondary | ICD-10-CM

## 2020-07-27 DIAGNOSIS — Z9989 Dependence on other enabling machines and devices: Secondary | ICD-10-CM | POA: Diagnosis not present

## 2020-07-27 NOTE — Progress Notes (Signed)
Subjective:  Patient ID: Ethan Weber, male    DOB: 1975/07/30  Age: 45 y.o. MRN: [id number]  CC:  Chief Complaint  Patient presents with  . Follow-up    Doing okay, no concerns  . Anemia  . Hyperlipidemia  . Sleep Apnea      HPI  This patient arrives today for the above.  Anemia: He has a history of thalassemia trait.  Last CBC showed stable hemoglobin.  Hyperlipidemia: Last LDL was 178, his ASCVD risk was approximately 2.6.  He is not currently on any medication for the treatment of his hyperlipidemia.  Sleep apnea: He does follow with neurology for management of this.  He does continue CPAP machine and reports compliance on a nightly basis.  Past Medical History:  Diagnosis Date  . Asthma    Childhood  . Deviated septum   . Thalassemia trait, beta       Family History  Problem Relation Age of Onset  . ALS Father   . 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   Social History   Tobacco Use  . Smoking status: Former Smoker    Packs/day: 0.50    Years: 2.00    Pack years: 1.00    Types: Cigarettes    Quit date: 04/09/1998    Years since quitting: 22.3  . Smokeless tobacco: Former Engineer, water Use Topics  . Alcohol use: Yes    Comment: Once a week     Current Meds  Medication Sig  . 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  . fluticasone (FLONASE) 50 MCG/ACT nasal spray SPRAY 2 SPRAYS INTO EACH NOSTRIL EVERY DAY (Patient taking differently: Place 1-2 sprays into both nostrils daily as needed.)  . loratadine (CLARITIN) 10 MG tablet Take 10 mg by mouth daily as needed for allergies.    ROS:  Review of Systems  Respiratory: Negative for shortness of breath.    Cardiovascular: Negative for chest pain.  Neurological: Negative for dizziness and headaches.     Objective:   Today's Vitals: BP 120/78   Pulse 65   Temp 97.9 F (36.6 C) (Temporal)   Ht 5\' 10"  (1.778 m)   Wt 222 lb 6.4 oz (100.9 kg)   SpO2 98%   BMI 31.91 kg/m  Vitals with BMI 07/27/2020 07/26/2020 03/22/2020  Height 5\' 10"  5\' 10"  5' 9.5"  Weight 222 lbs 6 oz 223 lbs 215 lbs 3 oz  BMI 31.91 32 31.33  Systolic 120 130 03/24/2020  Diastolic 78 72 78  Pulse 65 79 59     Physical Exam Vitals reviewed.  Constitutional:      Appearance: Normal appearance.  HENT:     Head: Normocephalic and atraumatic.  Cardiovascular:     Rate and Rhythm: Normal rate and regular rhythm.  Pulmonary:     Effort: Pulmonary effort is normal.     Breath sounds: Normal breath sounds.  Musculoskeletal:     Cervical back: Neck supple.  Skin:    General: Skin is warm and dry.  Neurological:     Mental Status: He is alert and oriented to person, place, and time.  Psychiatric:        Mood  and Affect: Mood normal.        Behavior: Behavior normal.        Thought Content: Thought content normal.        Judgment: Judgment normal.          Assessment and Plan   1. OSA on CPAP   2. Thalassemia trait, beta   3. Hyperlipidemia, unspecified hyperlipidemia type      Plan: 1.  Continue to follow-up with neurology as scheduled, he was encouraged to continue using his CPAP and plans on doing so. 2.  We will continue to monitor his CBC intermittently. 3.  We did discuss lipid panel and his ASCVD risk score as well as how to interpret this score.  He was encouraged to try to focus on a healthy lifestyle is much as possible including a diet full of fresh fruits, vegetables, legumes, whole grains, and to participate in physical activity on a regular basis.   Tests ordered No orders of the defined types were placed in this encounter.     No orders of the defined types were placed in this  encounter.   Patient to follow-up in 3 months or sooner as needed.  Elenore Paddy, NP

## 2020-07-28 ENCOUNTER — Ambulatory Visit: Payer: BC Managed Care – PPO | Admitting: Adult Health

## 2020-08-10 ENCOUNTER — Other Ambulatory Visit (INDEPENDENT_AMBULATORY_CARE_PROVIDER_SITE_OTHER): Payer: Self-pay | Admitting: Nurse Practitioner

## 2020-08-10 DIAGNOSIS — J310 Chronic rhinitis: Secondary | ICD-10-CM

## 2020-08-18 ENCOUNTER — Encounter (INDEPENDENT_AMBULATORY_CARE_PROVIDER_SITE_OTHER): Payer: Self-pay | Admitting: Internal Medicine

## 2020-08-18 ENCOUNTER — Ambulatory Visit (INDEPENDENT_AMBULATORY_CARE_PROVIDER_SITE_OTHER): Payer: BC Managed Care – PPO | Admitting: Internal Medicine

## 2020-08-18 ENCOUNTER — Other Ambulatory Visit: Payer: Self-pay

## 2020-08-18 VITALS — BP 130/75 | HR 87 | Temp 97.5°F | Resp 17 | Ht 70.0 in | Wt 222.0 lb

## 2020-08-18 DIAGNOSIS — S8011XA Contusion of right lower leg, initial encounter: Secondary | ICD-10-CM | POA: Diagnosis not present

## 2020-08-18 NOTE — Progress Notes (Signed)
Metrics: Intervention Frequency ACO  Documented Smoking Status Yearly  Screened one or more times in 24 months  Cessation Counseling or  Active cessation medication Past 24 months  Past 24 months   Guideline developer: UpToDate (See UpToDate for funding source) Date Released: 2014       Wellness Office Visit  Subjective:  Patient ID: Ethan Weber, male    DOB: December 14, 1975  Age: 45 y.o. MRN: [id number]  CC: Right upper leg swelling. HPI  This patient had a fall at work 3 weeks ago.  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  He sustained a large bruise and swelling and he showed me pictures of this.  Now he has a swelling in the right lateral femur area but it is not very painful.  He is able to walk. Past Medical History:  Diagnosis Date  . Asthma    Childhood  . Deviated septum   . Thalassemia trait, beta    Past Surgical History:  Procedure Laterality Date  . TYMPANOSTOMY TUBE PLACEMENT Bilateral    Age 60     Family History  Problem Relation Age of Onset  . ALS Father   . 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   Social History   Tobacco Use  . Smoking status: Former Smoker    Packs/day: 0.50    Years: 2.00    Pack years: 1.00    Types: Cigarettes    Quit date: 04/09/1998    Years since quitting: 22.3  . Smokeless tobacco: Former Engineer, water Use Topics  . Alcohol use: Yes    Comment: Once a week    Current Meds  Medication Sig  . 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  . fluticasone (FLONASE) 50 MCG/ACT nasal spray SPRAY 2 SPRAYS INTO EACH NOSTRIL EVERY DAY   . loratadine (CLARITIN) 10 MG tablet Take 10 mg by mouth daily as needed for allergies.     Flowsheet Row Office Visit from 07/27/2020 in [facility]  PHQ-9 Total Score 0      Objective:   Today's Vitals: BP 130/75 (BP Location: Left Arm, Patient Position: Sitting, Cuff Size: Normal)   Pulse 87   Temp (!) 97.5 F (36.4 C) (Temporal)   Resp 17   Ht 5\' 10"  (1.778 m)   Wt 222 lb (100.7 kg)   SpO2 98%   BMI 31.85 kg/m  Vitals with BMI 08/18/2020 07/27/2020 07/26/2020  Height 5\' 10"  5\' 10"  5\' 10"   Weight 222 lbs 222 lbs 6 oz 223 lbs  BMI 31.85 31.91 32  Systolic 130 120 07/28/2020  Diastolic 75 78 72  Pulse 87 65 79     Physical Exam   Examination of the right upper leg shows what appears to be a large hematoma measuring at least 5 cm x 4 cm.  It is warm to the touch and slightly tender.  It is slightly fluctuant.  There is no obvious evidence of underlying fracture.    Assessment   1. Hematoma of right lower extremity, initial encounter  Tests ordered Orders Placed This Encounter  Procedures  . DG FEMUR, MIN 2 VIEWS RIGHT     Plan: 1. I told the patient that the hematoma is likely to dissolve with time and may take several weeks.  In the meantime, we will get a x-ray of the right femur just to make sure there is no bony fracture. 2. He will follow-up in a couple of weeks to make sure things are getting better.   No orders of the defined types were placed in this encounter.   Wilson Singer, MD

## 2020-08-30 ENCOUNTER — Ambulatory Visit (INDEPENDENT_AMBULATORY_CARE_PROVIDER_SITE_OTHER): Payer: BC Managed Care – PPO | Admitting: Internal Medicine

## 2020-08-30 ENCOUNTER — Encounter (INDEPENDENT_AMBULATORY_CARE_PROVIDER_SITE_OTHER): Payer: Self-pay | Admitting: Internal Medicine

## 2020-08-30 ENCOUNTER — Other Ambulatory Visit: Payer: Self-pay

## 2020-08-30 VITALS — BP 133/82 | HR 76 | Temp 97.9°F | Resp 18 | Ht 70.0 in | Wt 221.6 lb

## 2020-08-30 DIAGNOSIS — S8011XD Contusion of right lower leg, subsequent encounter: Secondary | ICD-10-CM

## 2020-08-30 NOTE — Progress Notes (Signed)
Metrics: Intervention Frequency ACO  Documented Smoking Status Yearly  Screened one or more times in 24 months  Cessation Counseling or  Active cessation medication Past 24 months  Past 24 months   Guideline developer: UpToDate (See UpToDate for funding source) Date Released: 2014       Wellness Office Visit  Subjective:  Patient ID: Ethan Weber, male    DOB: [birth year]  Age: 45 y.o. MRN: [id number]  CC: Follow-up of right leg hematoma HPI Patient says that the hematoma significantly improved.  He did not remember to go and get a right femur x-ray but since he has improved significantly, he felt that there was no need for this test.  I tend to agree with him.  Past Medical History:  Diagnosis Date  . Asthma    Childhood  . Deviated septum   . Thalassemia trait, beta    Past Surgical History:  Procedure Laterality Date  . TYMPANOSTOMY TUBE PLACEMENT Bilateral    Age 49     Family History  Problem Relation Age of Onset  . ALS Father   . 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   Social History   Tobacco Use  . Smoking status: Former Smoker    Packs/day: 0.50    Years: 2.00    Pack years: 1.00    Types: Cigarettes    Quit date: 04/09/1998    Years since quitting: 22.4  . Smokeless tobacco: Former Engineer, water Use Topics  . Alcohol use: Yes    Comment: Once a week    Current Meds  Medication Sig  . 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  . fluticasone (FLONASE) 50 MCG/ACT nasal spray SPRAY 2 SPRAYS INTO EACH NOSTRIL EVERY DAY  . loratadine (CLARITIN) 10 MG tablet Take 10 mg by mouth daily as needed for allergies.     Flowsheet Row Office Visit from 07/27/2020 in [facility]  PHQ-9 Total Score 0      Objective:   Today's Vitals: BP 133/82 (BP Location: Left Arm, Patient Position: Sitting, Cuff Size: Normal)   Pulse 76   Temp 97.9 F (36.6 C) (Temporal)   Resp 18   Ht 5\' 10"  (1.778 m)   Wt 221 lb 9.6 oz (100.5 kg)   SpO2 98%   BMI 31.80 kg/m  Vitals with BMI 08/30/2020 08/18/2020 07/27/2020  Height 5\' 10"  5\' 10"  5\' 10"   Weight 221 lbs 10 oz 222 lbs 222 lbs 6 oz  BMI 31.8 31.85 31.91  Systolic 133 130 07/29/2020  Diastolic 82 75 78  Pulse 76 87 65     Physical Exam  Examination of his right thigh shows that the hematoma has significantly improved compared to the last time I had seen him, almost resolved.     Assessment   1. Hematoma of right lower extremity, subsequent encounter       Tests ordered No orders of the defined types were placed in this encounter.    Plan: 1. His hematoma is resolving and nothing further to be done. 2. I will see him in September for an annual physical exam.   No orders of the defined types were placed in this encounter.  Doree Albee, MD

## 2020-09-21 DIAGNOSIS — G4733 Obstructive sleep apnea (adult) (pediatric): Secondary | ICD-10-CM | POA: Diagnosis not present

## 2020-12-08 ENCOUNTER — Ambulatory Visit (INDEPENDENT_AMBULATORY_CARE_PROVIDER_SITE_OTHER): Payer: BC Managed Care – PPO | Admitting: Internal Medicine

## 2020-12-13 ENCOUNTER — Encounter (INDEPENDENT_AMBULATORY_CARE_PROVIDER_SITE_OTHER): Payer: BC Managed Care – PPO | Admitting: Internal Medicine

## 2021-01-11 ENCOUNTER — Ambulatory Visit (INDEPENDENT_AMBULATORY_CARE_PROVIDER_SITE_OTHER): Payer: BC Managed Care – PPO | Admitting: Internal Medicine

## 2021-01-11 ENCOUNTER — Encounter: Payer: Self-pay | Admitting: Internal Medicine

## 2021-01-11 ENCOUNTER — Other Ambulatory Visit: Payer: Self-pay

## 2021-01-11 VITALS — BP 112/72 | HR 75 | Temp 98.1°F | Resp 18 | Ht 70.0 in | Wt 225.0 lb

## 2021-01-11 DIAGNOSIS — E782 Mixed hyperlipidemia: Secondary | ICD-10-CM | POA: Diagnosis not present

## 2021-01-11 DIAGNOSIS — D563 Thalassemia minor: Secondary | ICD-10-CM | POA: Diagnosis not present

## 2021-01-11 DIAGNOSIS — Z2821 Immunization not carried out because of patient refusal: Secondary | ICD-10-CM

## 2021-01-11 DIAGNOSIS — E669 Obesity, unspecified: Secondary | ICD-10-CM

## 2021-01-11 DIAGNOSIS — Z7689 Persons encountering health services in other specified circumstances: Secondary | ICD-10-CM | POA: Diagnosis not present

## 2021-01-11 DIAGNOSIS — G4733 Obstructive sleep apnea (adult) (pediatric): Secondary | ICD-10-CM

## 2021-01-11 DIAGNOSIS — Z0001 Encounter for general adult medical examination with abnormal findings: Secondary | ICD-10-CM

## 2021-01-11 DIAGNOSIS — Z9989 Dependence on other enabling machines and devices: Secondary | ICD-10-CM

## 2021-01-11 DIAGNOSIS — J310 Chronic rhinitis: Secondary | ICD-10-CM

## 2021-01-11 NOTE — Assessment & Plan Note (Signed)

## 2021-01-11 NOTE — Assessment & Plan Note (Signed)
History of allergic rhinitis Claritin as needed Flonase nasal spray

## 2021-01-11 NOTE — Patient Instructions (Addendum)
Please get fasting blood tests done within a week.  Please follow low salt diet and perform moderate exercise/walking at least 150 mins/week.  Please take Folic acid 400 mcg once daily and Men's health Multivitamin once daily.

## 2021-01-11 NOTE — Assessment & Plan Note (Signed)
Diet modification and moderate exercise advised. 

## 2021-01-11 NOTE — Assessment & Plan Note (Signed)
Uses CPAP regularly, followed by neurology

## 2021-01-11 NOTE — Assessment & Plan Note (Signed)
Care established Previous chart reviewed History and medications reviewed with the patient 

## 2021-01-11 NOTE — Assessment & Plan Note (Signed)
Chart review suggests history of beta thalassemia trait Has microcytic anemia, chronic Advised to take folic acid supplement for now Check CBC

## 2021-01-11 NOTE — Assessment & Plan Note (Signed)
Lipid profile reviewed from 2021 Check lipid profile Advised to follow low-salt and low-cholesterol diet

## 2021-01-11 NOTE — Progress Notes (Signed)
New Patient Office Visit  Subjective:  Patient ID: Ethan Weber, male    DOB: 1976-01-22  Age: 45 y.o. MRN: [id number]  CC:  Chief Complaint  Patient presents with   Establish Care    HPI Lejuan Botto is up 45 year old male with PMH of allergic rhinitis, OSA on CPAP, beta thalassemia trait, HLD and obesity who presents for establishing care and annual physical.  He takes Claritin as needed and uses Flonase for allergic rhinitis.  Denies any nasal congestion, runny nose or sore throat currently.  Chart review suggests history of HLD.  He has been trying to follow low cholesterol diet.  He has a history of microcytic anemia, chronic.  He has had further analysis for it, which was suggestive of beta thalassemia trait.  He denies any active bleeding or easy bruising currently.  He has a history of OSA and uses CPAP regularly.  He sees neurology for this.  He has had 2 doses of COVID-vaccine.  Past Medical History:  Diagnosis Date   Asthma    Childhood   Deviated septum    Thalassemia trait, beta     Past Surgical History:  Procedure Laterality Date   TYMPANOSTOMY TUBE PLACEMENT Bilateral    Age 76    Family History  Problem Relation Age of Onset   ALS Father    Heart disease Maternal Grandmother     Social History   Socioeconomic History   Marital status: Single    Spouse name: Not on file   Number of children: 2   Years of education: Not on file   Highest education level: Not on file  Occupational History   Occupation: Glass blower/designer    Comment: [employer]  Tobacco Use   Smoking status: Former    Packs/day: 0.50    Years: 2.00    Pack years: 1.00    Types: Cigarettes    Quit date: 04/09/1998    Years since quitting: 22.7   Smokeless tobacco: Former  Scientific laboratory technician Use: Never used  Substance and Sexual Activity   Alcohol use: Yes    Comment: Once a week   Drug use: Not Currently    Comment: Marijuana in the past; quit in 1998   Sexual  activity: Not on file  Other Topics Concern   Not on file  Social History Narrative   Lives with girlfriend of 2 years and son for girlfriend.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   Social Determinants of Health   Financial Resource Strain: Not on file  Food Insecurity: Not on file  Transportation Needs: Not on file  Physical Activity: Not on file  Stress: Not on file  Social Connections: Not on file  Intimate Partner Violence: Not on file    ROS Review of Systems  Constitutional:  Negative for chills and fever.  HENT:  Negative for congestion and sore throat.   Eyes:  Negative for pain and discharge.  Respiratory:  Negative for cough and shortness of breath.   Cardiovascular:  Negative for chest pain and palpitations.  Gastrointestinal:  Negative for diarrhea, nausea and vomiting.  Endocrine: Negative for polydipsia and polyuria.  Genitourinary:  Negative for dysuria and hematuria.  Musculoskeletal:  Negative for neck pain and neck stiffness.  Skin:  Negative for rash.  Neurological:  Negative for dizziness, weakness, numbness and headaches.  Psychiatric/Behavioral:  Negative for agitation and behavioral problems.    Objective:   Today's Vitals: BP 112/72 (BP Location:  Left Arm, Patient Position: Sitting, Cuff Size: Normal)   Pulse 75   Temp 98.1 F (36.7 C)   Resp 18   Ht _0  (1.778 m)   Wt 225 lb (102.1 kg)   SpO2 97%   BMI 32.28 kg/m   Physical Exam Vitals reviewed.  Constitutional:      General: He is not in acute distress.    Appearance: He is obese. He is not diaphoretic.  HENT:     Head: Normocephalic and atraumatic.     Nose: Nose normal.     Mouth/Throat:     Mouth: Mucous membranes are moist.  Eyes:     General: No scleral icterus.    Extraocular Movements: Extraocular movements intact.  Cardiovascular:     Rate and Rhythm: Normal rate and regular rhythm.     Pulses: Normal pulses.     Heart sounds: Normal heart sounds.  No murmur heard. Pulmonary:     Breath sounds: Normal breath sounds. No wheezing or rales.  Abdominal:     Palpations: Abdomen is soft.     Tenderness: There is no abdominal tenderness.  Musculoskeletal:     Cervical back: Neck supple. No tenderness.     Right lower leg: No edema.     Left lower leg: No edema.  Skin:    General: Skin is warm.     Findings: No rash.  Neurological:     General: No focal deficit present.     Mental Status: He is alert and oriented to person, place, and time.     Cranial Nerves: No cranial nerve deficit.     Sensory: No sensory deficit.     Motor: No weakness.  Psychiatric:        Mood and Affect: Mood normal.        Behavior: Behavior normal.    Assessment & Plan:   Problem List Items Addressed This Visit       Encounter for general adult medical examination with abnormal findings - Primary   Physical exam as documented. Counseling done  re healthy lifestyle involving commitment to 150 minutes exercise per week, heart healthy diet, and attaining healthy weight.The importance of adequate sleep also discussed. Changes in health habits are decided on by the patient with goals and time frames  set for achieving them. Immunization and cancer screening needs are specifically addressed at this visit.     Relevant Orders  CBC with Differential/Platelet  CMP14+EGFR  Lipid panel  Hemoglobin A1c  TSH  Vitamin D (25 hydroxy)    Respiratory   Rhinitis    History of allergic rhinitis Claritin as needed Flonase nasal spray      OSA on CPAP    Uses CPAP regularly, followed by neurology         Hyperlipidemia    Lipid profile reviewed from 2021 Check lipid profile Advised to follow low-salt and low-cholesterol diet      Relevant Orders   Lipid panel   Obesity (BMI 30.0-34.9)    Diet modification and moderate exercise advised      Relevant Orders   Hemoglobin A1c   Thalassemia trait, beta    Chart review suggests history of beta  thalassemia trait Has microcytic anemia, chronic Advised to take folic acid supplement for now Check CBC      Relevant Orders   CBC with Differential/Platelet   Encounter to establish care    Care established Previous chart reviewed History and medications reviewed with the patient  Other Visit Diagnoses     Refused influenza vaccine           Outpatient Encounter Medications as of 01/11/2021  Medication Sig   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   fluticasone (FLONASE) 50 MCG/ACT nasal spray SPRAY 2 SPRAYS INTO EACH NOSTRIL EVERY DAY   loratadine (CLARITIN) 10 MG tablet Take 10 mg by mouth daily as needed for allergies.   No facility-administered encounter medications on file as of 01/11/2021.    Follow-up: Return in about 6 months (around 07/12/2021) for HLD.   Lindell Spar, MD

## 2021-01-12 DIAGNOSIS — E782 Mixed hyperlipidemia: Secondary | ICD-10-CM | POA: Diagnosis not present

## 2021-01-12 DIAGNOSIS — Z0001 Encounter for general adult medical examination with abnormal findings: Secondary | ICD-10-CM | POA: Diagnosis not present

## 2021-01-12 DIAGNOSIS — D563 Thalassemia minor: Secondary | ICD-10-CM | POA: Diagnosis not present

## 2021-01-12 DIAGNOSIS — E669 Obesity, unspecified: Secondary | ICD-10-CM | POA: Diagnosis not present

## 2021-01-13 LAB — CBC WITH DIFFERENTIAL/PLATELET
Basophils Absolute: 0.1 10*3/uL (ref 0.0–0.2)
Basos: 1 %
EOS (ABSOLUTE): 0.2 10*3/uL (ref 0.0–0.4)
Eos: 3 %
Hematocrit: 41.1 % (ref 37.5–51.0)
Hemoglobin: 12.5 g/dL — ABNORMAL LOW (ref 13.0–17.7)
Immature Grans (Abs): 0 10*3/uL (ref 0.0–0.1)
Immature Granulocytes: 1 %
Lymphocytes Absolute: 1.8 10*3/uL (ref 0.7–3.1)
Lymphs: 30 %
MCH: 18.4 pg — ABNORMAL LOW (ref 26.6–33.0)
MCHC: 30.4 g/dL — ABNORMAL LOW (ref 31.5–35.7)
MCV: 61 fL — ABNORMAL LOW (ref 79–97)
Monocytes Absolute: 0.6 10*3/uL (ref 0.1–0.9)
Monocytes: 10 %
Neutrophils Absolute: 3.4 10*3/uL (ref 1.4–7.0)
Neutrophils: 55 %
Platelets: 227 10*3/uL (ref 150–450)
RBC: 6.78 x10E6/uL — ABNORMAL HIGH (ref 4.14–5.80)
RDW: 18.7 % — ABNORMAL HIGH (ref 11.6–15.4)
WBC: 6.1 10*3/uL (ref 3.4–10.8)

## 2021-01-13 LAB — HEMOGLOBIN A1C
Est. average glucose Bld gHb Est-mCnc: 111 mg/dL
Hgb A1c MFr Bld: 5.5 % (ref 4.8–5.6)

## 2021-01-13 LAB — LIPID PANEL
Chol/HDL Ratio: 6.4 ratio — ABNORMAL HIGH (ref 0.0–5.0)
Cholesterol, Total: 268 mg/dL — ABNORMAL HIGH (ref 100–199)
HDL: 42 mg/dL (ref >39)
LDL Chol Calc (NIH): 166 mg/dL — ABNORMAL HIGH (ref 0–99)
Triglycerides: 315 mg/dL — ABNORMAL HIGH (ref 0–149)
VLDL Cholesterol Cal: 60 mg/dL — ABNORMAL HIGH (ref 5–40)

## 2021-01-13 LAB — CMP14+EGFR
ALT: 33 IU/L (ref 0–44)
AST: 22 IU/L (ref 0–40)
Albumin/Globulin Ratio: 1.9 (ref 1.2–2.2)
Albumin: 4.8 g/dL (ref 4.0–5.0)
Alkaline Phosphatase: 74 IU/L (ref 44–121)
BUN/Creatinine Ratio: 13 (ref 9–20)
BUN: 14 mg/dL (ref 6–24)
Bilirubin Total: 0.6 mg/dL (ref 0.0–1.2)
CO2: 25 mmol/L (ref 20–29)
Calcium: 9.5 mg/dL (ref 8.7–10.2)
Chloride: 100 mmol/L (ref 96–106)
Creatinine, Ser: 1.09 mg/dL (ref 0.76–1.27)
Globulin, Total: 2.5 g/dL (ref 1.5–4.5)
Glucose: 86 mg/dL (ref 70–99)
Potassium: 5 mmol/L (ref 3.5–5.2)
Sodium: 140 mmol/L (ref 134–144)
Total Protein: 7.3 g/dL (ref 6.0–8.5)
eGFR: 86 mL/min/{1.73_m2} (ref >59)

## 2021-01-13 LAB — VITAMIN D 25 HYDROXY (VIT D DEFICIENCY, FRACTURES): Vit D, 25-Hydroxy: 27.1 ng/mL — ABNORMAL LOW (ref 30.0–100.0)

## 2021-01-13 LAB — TSH: TSH: 2.75 u[IU]/mL (ref 0.450–4.500)

## 2021-01-17 ENCOUNTER — Encounter: Payer: Self-pay | Admitting: *Deleted

## 2021-01-30 DIAGNOSIS — G4733 Obstructive sleep apnea (adult) (pediatric): Secondary | ICD-10-CM | POA: Diagnosis not present

## 2021-04-04 DIAGNOSIS — G4733 Obstructive sleep apnea (adult) (pediatric): Secondary | ICD-10-CM | POA: Diagnosis not present

## 2021-04-20 ENCOUNTER — Encounter: Payer: Self-pay | Admitting: *Deleted

## 2021-04-20 DIAGNOSIS — Z2821 Immunization not carried out because of patient refusal: Secondary | ICD-10-CM | POA: Insufficient documentation

## 2021-07-12 ENCOUNTER — Encounter: Payer: Self-pay | Admitting: Internal Medicine

## 2021-07-12 ENCOUNTER — Ambulatory Visit (INDEPENDENT_AMBULATORY_CARE_PROVIDER_SITE_OTHER): Payer: BC Managed Care – PPO | Admitting: Internal Medicine

## 2021-07-12 DIAGNOSIS — D563 Thalassemia minor: Secondary | ICD-10-CM

## 2021-07-12 DIAGNOSIS — E669 Obesity, unspecified: Secondary | ICD-10-CM

## 2021-07-12 DIAGNOSIS — E782 Mixed hyperlipidemia: Secondary | ICD-10-CM | POA: Diagnosis not present

## 2021-07-12 NOTE — Assessment & Plan Note (Signed)
Chart review suggests history of beta thalassemia trait Has microcytic anemia, chronic Advised to take folic acid supplement for now Check CBC 

## 2021-07-12 NOTE — Assessment & Plan Note (Signed)
Lipid profile reviewed from last visit ?Check lipid profile - if persistently elevated, will start statin ?Advised to follow low-salt and low-cholesterol diet ?

## 2021-07-12 NOTE — Patient Instructions (Signed)
Please get fasting blood tests done within a week. ? ?Please continue to follow low-cholesterol diet and perform moderate exercise/walking at least 150 minutes/week. ?

## 2021-07-12 NOTE — Assessment & Plan Note (Signed)
Diet modification and moderate exercise advised. 

## 2021-07-12 NOTE — Progress Notes (Signed)
?  ? ?Virtual Visit via Telephone Note  ? ?This visit type was conducted due to national recommendations for restrictions regarding the COVID-19 Pandemic (e.g. social distancing) in an effort to limit this patient's exposure and mitigate transmission in our community.  Due to his co-morbid illnesses, this patient is at least at moderate risk for complications without adequate follow up.  This format is felt to be most appropriate for this patient at this time.  The patient did not have access to video technology/had technical difficulties with video requiring transitioning to audio format only (telephone).  All issues noted in this document were discussed and addressed.  No physical exam could be performed with this format. ? ?Evaluation Performed:  Follow-up visit ? ?Date:  07/12/2021  ? ?ID:  Ethan Weber, DOB 08/12/1975, MRN [id number] ? ?Patient Location: Home ?Provider Location: Office/Clinic ? ?Participants: Patient ?Location of Patient: Home ?Location of Provider: Telehealth ?Consent was obtain for visit to be over via telehealth. ?I verified that I am speaking with the correct person using two identifiers. ? ?PCP:  Anabel Halon, MD  ? ?Chief Complaint: Follow up of his chronic medical conditions ? ?History of Present Illness:   ? ?Ethan Weber is a 46 y.o. male with PMH of allergic rhinitis, OSA on CPAP, beta thalassemia trait, HLD and obesity who has a televisit for follow-up of his chronic medical conditions. ? ?Chart review suggests history of HLD.  He has been trying to follow low cholesterol diet. ? ?He has a history of microcytic anemia, chronic.  He has had further analysis for it, which was suggestive of beta thalassemia trait.  He denies any active bleeding or easy bruising currently. ? ?The patient does not have symptoms concerning for COVID-19 infection (fever, chills, cough, or new shortness of breath).  ? ?Past Medical, Surgical, Social History, Allergies, and Medications have been  Reviewed. ? ?Past Medical History:  ?Diagnosis Date  ? Asthma   ? Childhood  ? Deviated septum   ? Thalassemia trait, beta   ? ?Past Surgical History:  ?Procedure Laterality Date  ? TYMPANOSTOMY TUBE PLACEMENT Bilateral   ? Age 78  ?  ? ?Current Meds  ?Medication Sig  ? 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  ? fluticasone (FLONASE) 50 MCG/ACT nasal spray SPRAY 2 SPRAYS INTO EACH NOSTRIL EVERY DAY  ? loratadine (CLARITIN) 10 MG tablet Take 10 mg by mouth daily as needed for allergies.  ?  ? ?Allergies:   Penicillins  ? ?ROS:   ?Please see the history of present illness.    ? ?All other systems reviewed and are negative. ? ? ?Labs/Other Tests and Data Reviewed:   ? ?Recent Labs: ?01/12/2021: ALT 33; BUN 14; Creatinine, Ser 1.09; Hemoglobin 12.5; Platelets 227; Potassium 5.0; Sodium 140; TSH 2.750  ? ?Recent Lipid Panel ?Lab Results  ?Component Value Date/Time  ? CHOL 268 (H) 01/12/2021 04:07 PM  ? TRIG 315 (H) 01/12/2021 04:07 PM  ? HDL 42 01/12/2021 04:07 PM  ? CHOLHDL 6.4 (H) 01/12/2021 04:07 PM  ? CHOLHDL 5.9 (H) 03/22/2020 03:28 PM  ? LDLCALC 166 (H) 01/12/2021 04:07 PM  ? LDLCALC 178 (H) 03/22/2020 03:28 PM  ? ? ?Wt Readings from Last 3 Encounters:  ?01/11/21 225 lb (102.1 kg)  ?08/30/20 221 lb 9.6 oz (100.5 kg)  ?08/18/20 222 lb (100.7 kg)  ?  ? ?ASSESSMENT &  PLAN:   ? ?Hyperlipidemia ?Lipid profile reviewed from last visit ?Check lipid profile - if persistently elevated, will start statin ?Advised to follow low-salt and low-cholesterol diet ? ?Thalassemia trait, beta ?Chart review suggests history of beta thalassemia trait ?Has microcytic anemia, chronic ?Advised to take folic acid supplement for now ?Check CBC ? ?Obesity (BMI 30.0-34.9) ?Diet modification and moderate exercise advised ? ? ?Time:   ?Today, I have spent 15 minutes reviewing the chart, including problem list, medications, and  with the patient with telehealth technology discussing the above problems. ? ? ?Medication Adjustments/Labs and Tests Ordered: ?Current medicines are reviewed at length with the patient today.  Concerns regarding medicines are outlined above.  ? ?Tests Ordered: ?No orders of the defined types were placed in this encounter. ? ? ?Medication Changes: ?No orders of the defined types were placed in this encounter. ? ? ? ?Note: This dictation was prepared with Dragon dictation along with smaller phrase technology. Similar sounding words can be transcribed inadequately or may not be corrected upon review. Any transcriptional errors that result from this process are unintentional.  ?  ? ? ?Disposition:  Follow up  ?Signed, ?Anabel Halon, MD  ?07/12/2021 3:39 PM    ? ?[facility] ?[facility] ?

## 2021-07-26 ENCOUNTER — Telehealth: Payer: Self-pay | Admitting: Neurology

## 2021-07-26 ENCOUNTER — Ambulatory Visit: Payer: BC Managed Care – PPO | Admitting: Adult Health

## 2021-07-26 NOTE — Telephone Encounter (Signed)
LVM appt for today has been cancelled due to [facility], NP will be out. Ask pt to contact the office to reschedule appt with Jinny Blossom, NP ?

## 2021-08-08 DIAGNOSIS — G4733 Obstructive sleep apnea (adult) (pediatric): Secondary | ICD-10-CM | POA: Diagnosis not present

## 2021-08-28 NOTE — Progress Notes (Unsigned)
PATIENT: Ethan Weber DOB: Feb 09, 1976  REASON FOR VISIT: follow up HISTORY FROM: patient PRIMARY NEUROLOGIST: Dr. Brett Fairy  Chief Complaint  Patient presents with   Follow-up    Pt in 19 pt is here for CPAP follow up  pt states no questions or concerns for today's visit       HISTORY OF PRESENT ILLNESS: Today 08/29/21:  Ethan Weber is a 46 year old male with a history of OSA on CPAP.  Reports that CPAP is working well for him.  He states on occasion he will wake up with a headache and may feel fatigued throughout the day but this is not common.  His download is below    REVIEW OF SYSTEMS: Out of a complete 14 system review of symptoms, the patient complains only of the following symptoms, and all other reviewed systems are negative.  [id number]  ALLERGIES: Allergies  Allergen Reactions   Penicillins     HOME MEDICATIONS: Outpatient Medications Prior to Visit  Medication Sig Dispense Refill   Ascorbic Acid (VITAMIN C PO) Take by mouth daily.     fluticasone (FLONASE) 50 MCG/ACT nasal spray SPRAY 2 SPRAYS INTO EACH NOSTRIL EVERY DAY (Patient taking differently: as needed.) 48 mL 2   loratadine (CLARITIN) 10 MG tablet Take 10 mg by mouth daily as needed for allergies.     Multiple Vitamin (MULTIVITAMIN PO) Take by mouth daily.     Omega-3 Fatty Acids (FISH OIL PO) Take by mouth daily.     VITAMIN E PO Take by mouth daily.     diphenhydrAMINE (BENADRYL) 50 MG capsule Take 50 mg by mouth as needed for sleep (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 (Patient not taking: Reported on 08/29/2021)     No facility-administered medications prior to visit.    PAST MEDICAL HISTORY: Past Medical History:  Diagnosis Date   Asthma    Childhood   Deviated septum    Thalassemia trait, beta     PAST SURGICAL HISTORY: Past Surgical History:  Procedure Laterality Date   TYMPANOSTOMY TUBE PLACEMENT Bilateral    Age 71    FAMILY  HISTORY: Family History  Problem Relation Age of Onset   ALS Father    Heart disease Maternal Grandmother    Sleep apnea Neg Hx     SOCIAL HISTORY: Social History   Socioeconomic History   Marital status: Single    Spouse name: Not on file   Number of children: 2   Years of education: Not on file   Highest education level: Not on file  Occupational History   Occupation: Glass blower/designer    Comment: [employer]  Tobacco Use   Smoking status: Former    Packs/day: 0.50    Years: 2.00    Pack years: 1.00    Types: Cigarettes    Quit date: 04/09/1998    Years since quitting: 23.4   Smokeless tobacco: Former  Scientific laboratory technician Use: Never used  Substance and Sexual Activity   Alcohol use: Yes    Comment: Once a week   Drug use: Not Currently    Comment: Marijuana in the past; quit in 1998   Sexual activity: Not on file  Other Topics Concern   Not on file  Social History Narrative   Lives with girlfriend of 2 years and son for girlfriend.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   Social Determinants of Health  Financial Resource Strain: Not on file  Food Insecurity: Not on file  Transportation Needs: Not on file  Physical Activity: Not on file  Stress: Not on file  Social Connections: Not on file  Intimate Partner Violence: Not on file      PHYSICAL EXAM  Vitals:   08/29/21 1445  BP: 126/73  Pulse: (!) 57  Weight: 216 lb 3.2 oz (98.1 kg)  Height: 5\' 10"  (1.778 m)   Body mass index is 31.02 kg/m.  Generalized: Well developed, in no acute distress  Chest: Lungs clear to auscultation bilaterally  Neurological examination  Mentation: Alert oriented to time, place, history taking. Follows all commands speech and language fluent Gait and station: Gait is normal.    DIAGNOSTIC DATA (LABS, IMAGING, TESTING) - I reviewed patient records, labs, notes, testing and imaging myself where available.  Lab Results  Component Value Date    WBC 6.1 01/12/2021   HGB 12.5 (L) 01/12/2021   HCT 41.1 01/12/2021   MCV 61 (L) 01/12/2021   PLT 227 01/12/2021      Component Value Date/Time   NA 140 01/12/2021 1607   K 5.0 01/12/2021 1607   CL 100 01/12/2021 1607   CO2 25 01/12/2021 1607   GLUCOSE 86 01/12/2021 1607   GLUCOSE 89 11/11/2019 1524   BUN 14 01/12/2021 1607   CREATININE 1.09 01/12/2021 1607   CREATININE 1.18 11/11/2019 1524   CALCIUM 9.5 01/12/2021 1607   PROT 7.3 01/12/2021 1607   ALBUMIN 4.8 01/12/2021 1607   AST 22 01/12/2021 1607   ALT 33 01/12/2021 1607   ALKPHOS 74 01/12/2021 1607   BILITOT 0.6 01/12/2021 1607   GFRNONAA 76 11/11/2019 1524   GFRAA 88 11/11/2019 1524   Lab Results  Component Value Date   CHOL 268 (H) 01/12/2021   HDL 42 01/12/2021   LDLCALC 166 (H) 01/12/2021   TRIG 315 (H) 01/12/2021   CHOLHDL 6.4 (H) 01/12/2021   Lab Results  Component Value Date   HGBA1C 5.5 01/12/2021   No results found for: VITAMINB12 Lab Results  Component Value Date   TSH 2.750 01/12/2021      ASSESSMENT AND PLAN 46 y.o. year old male  has a past medical history of Asthma, Deviated septum, and Thalassemia trait, beta. here with:  OSA on CPAP  - CPAP compliance excellent - Good treatment of AHI  - Encourage patient to use CPAP nightly and > 4 hours each night - F/U in 1 year or sooner if needed    Ward Givens, MSN, NP-C 08/29/2021, 3:03 PM [facility] [address] [place], [place] [address] [phone]

## 2021-08-29 ENCOUNTER — Encounter: Payer: Self-pay | Admitting: Adult Health

## 2021-08-29 ENCOUNTER — Ambulatory Visit (INDEPENDENT_AMBULATORY_CARE_PROVIDER_SITE_OTHER): Payer: BC Managed Care – PPO | Admitting: Adult Health

## 2021-08-29 VITALS — BP 126/73 | HR 57 | Ht 70.0 in | Wt 216.2 lb

## 2021-08-29 DIAGNOSIS — G4733 Obstructive sleep apnea (adult) (pediatric): Secondary | ICD-10-CM | POA: Diagnosis not present

## 2021-08-29 DIAGNOSIS — Z9989 Dependence on other enabling machines and devices: Secondary | ICD-10-CM | POA: Diagnosis not present

## 2021-08-29 NOTE — Patient Instructions (Signed)
Continue using CPAP nightly and greater than 4 hours each night °If your symptoms worsen or you develop new symptoms please let us know.  ° °

## 2021-09-08 DIAGNOSIS — G4733 Obstructive sleep apnea (adult) (pediatric): Secondary | ICD-10-CM | POA: Diagnosis not present

## 2021-10-08 DIAGNOSIS — G4733 Obstructive sleep apnea (adult) (pediatric): Secondary | ICD-10-CM | POA: Diagnosis not present

## 2022-03-20 DIAGNOSIS — G4733 Obstructive sleep apnea (adult) (pediatric): Secondary | ICD-10-CM | POA: Diagnosis not present

## 2022-04-20 DIAGNOSIS — G4733 Obstructive sleep apnea (adult) (pediatric): Secondary | ICD-10-CM | POA: Diagnosis not present

## 2022-05-21 DIAGNOSIS — G4733 Obstructive sleep apnea (adult) (pediatric): Secondary | ICD-10-CM | POA: Diagnosis not present

## 2022-08-30 ENCOUNTER — Encounter: Payer: Self-pay | Admitting: *Deleted

## 2022-09-04 ENCOUNTER — Telehealth: Payer: BC Managed Care – PPO | Admitting: Adult Health

## 2022-09-06 DIAGNOSIS — G4733 Obstructive sleep apnea (adult) (pediatric): Secondary | ICD-10-CM | POA: Diagnosis not present

## 2022-10-07 DIAGNOSIS — G4733 Obstructive sleep apnea (adult) (pediatric): Secondary | ICD-10-CM | POA: Diagnosis not present

## 2022-11-06 DIAGNOSIS — G4733 Obstructive sleep apnea (adult) (pediatric): Secondary | ICD-10-CM | POA: Diagnosis not present

## 2023-01-13 DIAGNOSIS — G4733 Obstructive sleep apnea (adult) (pediatric): Secondary | ICD-10-CM | POA: Diagnosis not present

## 2023-02-13 DIAGNOSIS — G4733 Obstructive sleep apnea (adult) (pediatric): Secondary | ICD-10-CM | POA: Diagnosis not present

## 2023-03-15 DIAGNOSIS — G4733 Obstructive sleep apnea (adult) (pediatric): Secondary | ICD-10-CM | POA: Diagnosis not present
